# Patient Record
Sex: Male | Born: 1935 | Race: White | Hispanic: No | Marital: Married
Health system: Southern US, Community
[De-identification: ages and names within clinical notes are randomized; demographics above are authoritative.]

## PROBLEM LIST (undated history)

## (undated) DIAGNOSIS — C801 Malignant (primary) neoplasm, unspecified: Secondary | ICD-10-CM

## (undated) DIAGNOSIS — E119 Type 2 diabetes mellitus without complications: Secondary | ICD-10-CM

## (undated) DIAGNOSIS — C4490 Unspecified malignant neoplasm of skin, unspecified: Secondary | ICD-10-CM

## (undated) DIAGNOSIS — I2109 ST elevation (STEMI) myocardial infarction involving other coronary artery of anterior wall: Secondary | ICD-10-CM

## (undated) DIAGNOSIS — I10 Essential (primary) hypertension: Secondary | ICD-10-CM

## (undated) DIAGNOSIS — R001 Bradycardia, unspecified: Secondary | ICD-10-CM

## (undated) DIAGNOSIS — R570 Cardiogenic shock: Secondary | ICD-10-CM

---

## 2002-07-25 ENCOUNTER — Ambulatory Visit: Admission: RE | Admit: 2002-07-25 | Discharge: 2002-10-04 | Payer: Self-pay | Admitting: Radiation Oncology

## 2002-08-13 ENCOUNTER — Ambulatory Visit (HOSPITAL_COMMUNITY): Admission: RE | Admit: 2002-08-13 | Discharge: 2002-08-13 | Payer: Self-pay | Admitting: Radiation Oncology

## 2002-08-15 ENCOUNTER — Other Ambulatory Visit: Admission: RE | Admit: 2002-08-15 | Discharge: 2002-08-15 | Payer: Self-pay | Admitting: Oncology

## 2002-08-15 ENCOUNTER — Encounter: Payer: Self-pay | Admitting: Oncology

## 2003-01-05 ENCOUNTER — Ambulatory Visit: Admission: RE | Admit: 2003-01-05 | Discharge: 2003-01-05 | Payer: Self-pay | Admitting: Radiation Oncology

## 2003-03-07 ENCOUNTER — Ambulatory Visit: Admission: RE | Admit: 2003-03-07 | Discharge: 2003-03-07 | Payer: Self-pay | Admitting: Radiation Oncology

## 2003-05-08 ENCOUNTER — Ambulatory Visit: Admission: RE | Admit: 2003-05-08 | Discharge: 2003-05-08 | Payer: Self-pay | Admitting: Radiation Oncology

## 2003-08-07 ENCOUNTER — Ambulatory Visit: Admission: RE | Admit: 2003-08-07 | Discharge: 2003-08-07 | Payer: Self-pay | Admitting: Radiation Oncology

## 2003-08-10 ENCOUNTER — Ambulatory Visit: Admission: RE | Admit: 2003-08-10 | Discharge: 2003-08-10 | Payer: Self-pay | Admitting: Radiation Oncology

## 2003-12-12 ENCOUNTER — Ambulatory Visit: Admission: RE | Admit: 2003-12-12 | Discharge: 2003-12-12 | Payer: Self-pay | Admitting: Radiation Oncology

## 2004-04-12 ENCOUNTER — Ambulatory Visit: Admission: RE | Admit: 2004-04-12 | Discharge: 2004-04-12 | Payer: Self-pay | Admitting: *Deleted

## 2004-10-09 ENCOUNTER — Ambulatory Visit: Admission: RE | Admit: 2004-10-09 | Discharge: 2004-10-09 | Payer: Self-pay | Admitting: *Deleted

## 2012-04-05 ENCOUNTER — Ambulatory Visit
Admission: RE | Admit: 2012-04-05 | Discharge: 2012-04-05 | Disposition: A | Discharge status: Home / Self Care | Payer: Medicare Other | Source: Ambulatory Visit | Attending: Family Medicine | Admitting: Family Medicine

## 2012-04-05 ENCOUNTER — Other Ambulatory Visit: Payer: Self-pay | Admitting: Family Medicine

## 2012-04-05 DIAGNOSIS — R05 Cough: Secondary | ICD-10-CM

## 2012-04-05 DIAGNOSIS — R509 Fever, unspecified: Secondary | ICD-10-CM

## 2013-03-06 ENCOUNTER — Emergency Department (HOSPITAL_COMMUNITY): Payer: Medicare Other

## 2013-03-06 ENCOUNTER — Inpatient Hospital Stay (HOSPITAL_COMMUNITY): Payer: Medicare Other

## 2013-03-06 ENCOUNTER — Ambulatory Visit (HOSPITAL_COMMUNITY): Admit: 2013-03-06 | Payer: Self-pay | Admitting: Interventional Cardiology

## 2013-03-06 ENCOUNTER — Inpatient Hospital Stay (HOSPITAL_COMMUNITY)
Admission: EM | Admit: 2013-03-06 | Discharge: 2013-03-17 | DRG: 238 | Disposition: E | Payer: Medicare Other | Attending: Internal Medicine | Admitting: Internal Medicine

## 2013-03-06 ENCOUNTER — Encounter (HOSPITAL_COMMUNITY): Payer: Self-pay | Admitting: Emergency Medicine

## 2013-03-06 ENCOUNTER — Encounter (HOSPITAL_COMMUNITY): Admission: EM | Disposition: E | Payer: Self-pay | Source: Home / Self Care | Attending: Internal Medicine

## 2013-03-06 DIAGNOSIS — I2510 Atherosclerotic heart disease of native coronary artery without angina pectoris: Secondary | ICD-10-CM

## 2013-03-06 DIAGNOSIS — I472 Ventricular tachycardia, unspecified: Secondary | ICD-10-CM | POA: Diagnosis present

## 2013-03-06 DIAGNOSIS — I469 Cardiac arrest, cause unspecified: Secondary | ICD-10-CM

## 2013-03-06 DIAGNOSIS — Z515 Encounter for palliative care: Secondary | ICD-10-CM

## 2013-03-06 DIAGNOSIS — I498 Other specified cardiac arrhythmias: Secondary | ICD-10-CM

## 2013-03-06 DIAGNOSIS — I10 Essential (primary) hypertension: Secondary | ICD-10-CM

## 2013-03-06 DIAGNOSIS — Z85828 Personal history of other malignant neoplasm of skin: Secondary | ICD-10-CM

## 2013-03-06 DIAGNOSIS — I4729 Other ventricular tachycardia: Secondary | ICD-10-CM | POA: Diagnosis present

## 2013-03-06 DIAGNOSIS — I2109 ST elevation (STEMI) myocardial infarction involving other coronary artery of anterior wall: Secondary | ICD-10-CM

## 2013-03-06 DIAGNOSIS — R570 Cardiogenic shock: Secondary | ICD-10-CM

## 2013-03-06 DIAGNOSIS — I4510 Unspecified right bundle-branch block: Secondary | ICD-10-CM | POA: Diagnosis present

## 2013-03-06 DIAGNOSIS — I359 Nonrheumatic aortic valve disorder, unspecified: Secondary | ICD-10-CM

## 2013-03-06 DIAGNOSIS — E119 Type 2 diabetes mellitus without complications: Secondary | ICD-10-CM | POA: Diagnosis present

## 2013-03-06 DIAGNOSIS — I214 Non-ST elevation (NSTEMI) myocardial infarction: Principal | ICD-10-CM

## 2013-03-06 DIAGNOSIS — J811 Chronic pulmonary edema: Secondary | ICD-10-CM | POA: Diagnosis present

## 2013-03-06 DIAGNOSIS — R001 Bradycardia, unspecified: Secondary | ICD-10-CM

## 2013-03-06 DIAGNOSIS — E785 Hyperlipidemia, unspecified: Secondary | ICD-10-CM

## 2013-03-06 HISTORY — DX: Bradycardia, unspecified: R00.1

## 2013-03-06 HISTORY — DX: Malignant (primary) neoplasm, unspecified: C80.1

## 2013-03-06 HISTORY — DX: ST elevation (STEMI) myocardial infarction involving other coronary artery of anterior wall: I21.09

## 2013-03-06 HISTORY — PX: PERCUTANEOUS CORONARY STENT INTERVENTION (PCI-S): SHX5485

## 2013-03-06 HISTORY — PX: INTRA-AORTIC BALLOON PUMP INSERTION: SHX5475

## 2013-03-06 HISTORY — PX: TEMPORARY PACEMAKER INSERTION: SHX5471

## 2013-03-06 HISTORY — PX: LEFT HEART CATH: SHX5478

## 2013-03-06 HISTORY — DX: Type 2 diabetes mellitus without complications: E11.9

## 2013-03-06 HISTORY — DX: Essential (primary) hypertension: I10

## 2013-03-06 HISTORY — DX: Unspecified malignant neoplasm of skin, unspecified: C44.90

## 2013-03-06 HISTORY — DX: Cardiogenic shock: R57.0

## 2013-03-06 LAB — POCT I-STAT 3, ART BLOOD GAS (G3+)
Acid-base deficit: 9 mmol/L — ABNORMAL HIGH (ref 0.0–2.0)
Bicarbonate: 16.8 mEq/L — ABNORMAL LOW (ref 20.0–24.0)
Bicarbonate: 16.7 mEq/L — ABNORMAL LOW (ref 20.0–24.0)
O2 Saturation: 97 %
O2 Saturation: 84 %
Patient temperature: 33.4
TCO2: 19 mmol/L (ref 0–100)
TCO2: 18 mmol/L (ref 0–100)
pCO2 arterial: 41.4 mmHg (ref 35.0–45.0)
pCO2 arterial: 31.1 mmHg — ABNORMAL LOW (ref 35.0–45.0)
pCO2 arterial: 37.6 mmHg (ref 35.0–45.0)
pH, Arterial: 7.233 — ABNORMAL LOW (ref 7.350–7.450)
pH, Arterial: 7.322 — ABNORMAL LOW (ref 7.350–7.450)
pH, Arterial: 7.257 — ABNORMAL LOW (ref 7.350–7.450)
pO2, Arterial: 57 mmHg — ABNORMAL LOW (ref 80.0–100.0)

## 2013-03-06 LAB — POCT I-STAT, CHEM 8
BUN: 22 mg/dL (ref 6–23)
BUN: 26 mg/dL — ABNORMAL HIGH (ref 6–23)
BUN: 20 mg/dL (ref 6–23)
BUN: 20 mg/dL (ref 6–23)
Calcium, Ion: 1.03 mmol/L — ABNORMAL LOW (ref 1.13–1.30)
Calcium, Ion: 1.06 mmol/L — ABNORMAL LOW (ref 1.13–1.30)
Calcium, Ion: 1.06 mmol/L — ABNORMAL LOW (ref 1.13–1.30)
Calcium, Ion: 1.07 mmol/L — ABNORMAL LOW (ref 1.13–1.30)
Chloride: 106 mEq/L (ref 96–112)
Chloride: 108 mEq/L (ref 96–112)
Creatinine, Ser: 0.9 mg/dL (ref 0.50–1.35)
Creatinine, Ser: 0.8 mg/dL (ref 0.50–1.35)
Creatinine, Ser: 0.8 mg/dL (ref 0.50–1.35)
Creatinine, Ser: 0.9 mg/dL (ref 0.50–1.35)
Glucose, Bld: 323 mg/dL — ABNORMAL HIGH (ref 70–99)
Glucose, Bld: 313 mg/dL — ABNORMAL HIGH (ref 70–99)
Glucose, Bld: 319 mg/dL — ABNORMAL HIGH (ref 70–99)
Glucose, Bld: 313 mg/dL — ABNORMAL HIGH (ref 70–99)
Glucose, Bld: 293 mg/dL — ABNORMAL HIGH (ref 70–99)
HCT: 41 % (ref 39.0–52.0)
HCT: 42 % (ref 39.0–52.0)
Hemoglobin: 13.9 g/dL (ref 13.0–17.0)
Hemoglobin: 13.9 g/dL (ref 13.0–17.0)
Potassium: 3.3 mEq/L — ABNORMAL LOW (ref 3.5–5.1)
Potassium: 3.8 mEq/L (ref 3.5–5.1)
Potassium: 3 mEq/L — ABNORMAL LOW (ref 3.5–5.1)
Potassium: 3.2 mEq/L — ABNORMAL LOW (ref 3.5–5.1)
Sodium: 144 mEq/L (ref 135–145)
TCO2: 17 mmol/L (ref 0–100)
TCO2: 15 mmol/L (ref 0–100)
TCO2: 15 mmol/L (ref 0–100)

## 2013-03-06 LAB — BLOOD GAS, ARTERIAL
Acid-base deficit: 10.6 mmol/L — ABNORMAL HIGH (ref 0.0–2.0)
Bicarbonate: 16.1 mEq/L — ABNORMAL LOW (ref 20.0–24.0)
Drawn by: 25770
FIO2: 1 %
MECHVT: 600 mL
O2 Saturation: 82.1 %
PEEP: 10 cmH2O
Patient temperature: 98.6
RATE: 15 resp/min
TCO2: 17.4 mmol/L (ref 0–100)
pCO2 arterial: 43.6 mmHg (ref 35.0–45.0)
pH, Arterial: 7.191 — CL (ref 7.350–7.450)
pO2, Arterial: 58.7 mmHg — ABNORMAL LOW (ref 80.0–100.0)

## 2013-03-06 LAB — POCT I-STAT TROPONIN I

## 2013-03-06 LAB — CBC WITH DIFFERENTIAL/PLATELET
Basophils Absolute: 0 10*3/uL (ref 0.0–0.1)
Basophils Relative: 0 % (ref 0–1)
Eosinophils Absolute: 0.1 10*3/uL (ref 0.0–0.7)
HCT: 38.5 % — ABNORMAL LOW (ref 39.0–52.0)
Hemoglobin: 12.8 g/dL — ABNORMAL LOW (ref 13.0–17.0)
Lymphocytes Relative: 37 % (ref 12–46)
MCH: 29 pg (ref 26.0–34.0)
MCHC: 33.2 g/dL (ref 30.0–36.0)
Monocytes Absolute: 0.6 10*3/uL (ref 0.1–1.0)
Neutro Abs: 6.5 10*3/uL (ref 1.7–7.7)
Neutrophils Relative %: 57 % (ref 43–77)
Platelets: 173 10*3/uL (ref 150–400)

## 2013-03-06 LAB — CREATININE, SERUM
Creatinine, Ser: 0.91 mg/dL (ref 0.50–1.35)
GFR calc Af Amer: >90 mL/min (ref >90)
GFR calc non Af Amer: 80 mL/min — ABNORMAL LOW (ref >90)

## 2013-03-06 LAB — CBC
HCT: 40.1 % (ref 39.0–52.0)
Hemoglobin: 13.6 g/dL (ref 13.0–17.0)
MCH: 28.6 pg (ref 26.0–34.0)
MCHC: 33.9 g/dL (ref 30.0–36.0)
WBC: 20.2 10*3/uL — ABNORMAL HIGH (ref 4.0–10.5)

## 2013-03-06 LAB — COMPREHENSIVE METABOLIC PANEL
ALT: 181 U/L — ABNORMAL HIGH (ref 0–53)
Albumin: 2.9 g/dL — ABNORMAL LOW (ref 3.5–5.2)
Alkaline Phosphatase: 54 U/L (ref 39–117)
BUN: 26 mg/dL — ABNORMAL HIGH (ref 6–23)
Potassium: 3.1 mEq/L — ABNORMAL LOW (ref 3.5–5.1)
Sodium: 139 mEq/L (ref 135–145)
Total Protein: 5.3 g/dL — ABNORMAL LOW (ref 6.0–8.3)

## 2013-03-06 LAB — CG4 I-STAT (LACTIC ACID): Lactic Acid, Venous: 6.46 mmol/L — ABNORMAL HIGH (ref 0.5–2.2)

## 2013-03-06 LAB — URINE MICROSCOPIC-ADD ON

## 2013-03-06 LAB — GLUCOSE, CAPILLARY
Glucose-Capillary: 254 mg/dL — ABNORMAL HIGH (ref 70–99)
Glucose-Capillary: 248 mg/dL — ABNORMAL HIGH (ref 70–99)

## 2013-03-06 LAB — URINALYSIS, ROUTINE W REFLEX MICROSCOPIC
Bilirubin Urine: NEGATIVE
Ketones, ur: 15 mg/dL — AB
Nitrite: NEGATIVE
Protein, ur: NEGATIVE mg/dL
Specific Gravity, Urine: >1.046 — ABNORMAL HIGH (ref 1.005–1.030)
Urobilinogen, UA: 1 mg/dL (ref 0.0–1.0)
pH: 5 (ref 5.0–8.0)

## 2013-03-06 LAB — APTT: aPTT: 30 seconds (ref 24–37)

## 2013-03-06 LAB — PRO B NATRIURETIC PEPTIDE: Pro B Natriuretic peptide (BNP): 130.9 pg/mL (ref 0–450)

## 2013-03-06 LAB — TROPONIN I
Troponin I: 8.05 ng/mL (ref <0.30)
Troponin I: >20 ng/mL (ref <0.30)

## 2013-03-06 LAB — PROTIME-INR
INR: 1.2 (ref 0.00–1.49)
Prothrombin Time: 14.9 seconds (ref 11.6–15.2)

## 2013-03-06 LAB — MRSA PCR SCREENING: MRSA by PCR: NEGATIVE

## 2013-03-06 LAB — MAGNESIUM: Magnesium: 2 mg/dL (ref 1.5–2.5)

## 2013-03-06 LAB — PROCALCITONIN: Procalcitonin: <0.1 ng/mL

## 2013-03-06 SURGERY — LEFT HEART CATH
Anesthesia: LOCAL

## 2013-03-06 MED ORDER — CISATRACURIUM BOLUS VIA INFUSION
0.1000 mg/kg | Freq: Once | INTRAVENOUS | Status: AC
  Administered 2013-03-06: 7.9 mg via INTRAVENOUS
  Filled 2013-03-06: qty 8

## 2013-03-06 MED ORDER — HEPARIN (PORCINE) IN NACL 100-0.45 UNIT/ML-% IJ SOLN
1100.0000 [IU]/h | INTRAMUSCULAR | Status: DC
  Filled 2013-03-06 (×2): qty 250

## 2013-03-06 MED ORDER — NOREPINEPHRINE BITARTRATE 1 MG/ML IJ SOLN
0.5000 ug/min | INTRAMUSCULAR | Status: DC
  Administered 2013-03-06: 0.5 ug/min via INTRAVENOUS
  Filled 2013-03-06 (×2): qty 4

## 2013-03-06 MED ORDER — LIDOCAINE HCL (CARDIAC) 20 MG/ML IV SOLN
INTRAVENOUS | Status: AC
  Filled 2013-03-06: qty 5

## 2013-03-06 MED ORDER — TICAGRELOR 90 MG PO TABS
ORAL_TABLET | ORAL | Status: AC
  Filled 2013-03-06: qty 1

## 2013-03-06 MED ORDER — SODIUM CHLORIDE 0.9 % IV SOLN: 1.0000 mL/kg/h | INTRAVENOUS | Status: AC

## 2013-03-06 MED ORDER — SODIUM CHLORIDE 0.9 % IV SOLN
INTRAVENOUS | Status: DC
  Administered 2013-03-06: 1.9 [IU]/h via INTRAVENOUS
  Administered 2013-03-07: 14.2 [IU]/h via INTRAVENOUS
  Filled 2013-03-06 (×2): qty 1

## 2013-03-06 MED ORDER — AMIODARONE HCL IN DEXTROSE 360-4.14 MG/200ML-% IV SOLN: 30.0000 mg/h | INTRAVENOUS | Status: DC

## 2013-03-06 MED ORDER — CISATRACURIUM BESYLATE 10 MG/ML IV SOLN
1.0000 ug/kg/min | INTRAVENOUS | Status: DC
  Administered 2013-03-06: 1 ug/kg/min via INTRAVENOUS
  Filled 2013-03-06: qty 20

## 2013-03-06 MED ORDER — HEPARIN SODIUM (PORCINE) 1000 UNIT/ML IJ SOLN
INTRAMUSCULAR | Status: AC
  Filled 2013-03-06: qty 1

## 2013-03-06 MED ORDER — HEPARIN (PORCINE) IN NACL 2-0.9 UNIT/ML-% IJ SOLN
INTRAMUSCULAR | Status: AC
  Filled 2013-03-06: qty 1000

## 2013-03-06 MED ORDER — HEPARIN SODIUM (PORCINE) 5000 UNIT/ML IJ SOLN
5000.0000 [IU] | Freq: Three times a day (TID) | INTRAMUSCULAR | Status: DC
  Filled 2013-03-06 (×3): qty 1

## 2013-03-06 MED ORDER — SODIUM CHLORIDE 0.9 % IV SOLN: 250.0000 mL | INTRAVENOUS | Status: DC | PRN

## 2013-03-06 MED ORDER — ASPIRIN EC 325 MG PO TBEC
325.0000 mg | DELAYED_RELEASE_TABLET | Freq: Every day | ORAL | Status: DC
  Filled 2013-03-06: qty 1

## 2013-03-06 MED ORDER — HEPARIN (PORCINE) IN NACL 2-0.9 UNIT/ML-% IJ SOLN
INTRAMUSCULAR | Status: AC
  Filled 2013-03-06: qty 500

## 2013-03-06 MED ORDER — SUCCINYLCHOLINE CHLORIDE 20 MG/ML IJ SOLN
INTRAMUSCULAR | Status: AC
  Filled 2013-03-06: qty 1

## 2013-03-06 MED ORDER — SODIUM CHLORIDE 0.9 % IV SOLN
25.0000 ug/h | INTRAVENOUS | Status: DC
  Administered 2013-03-06: 25 ug/h via INTRAVENOUS
  Filled 2013-03-06: qty 50

## 2013-03-06 MED ORDER — NITROGLYCERIN 0.2 MG/ML ON CALL CATH LAB
INTRAVENOUS | Status: AC
  Filled 2013-03-06: qty 1

## 2013-03-06 MED ORDER — ACETAMINOPHEN 325 MG PO TABS: 650.0000 mg | ORAL_TABLET | ORAL | Status: DC | PRN

## 2013-03-06 MED ORDER — CISATRACURIUM BOLUS VIA INFUSION
0.0500 mg/kg | INTRAVENOUS | Status: DC | PRN
  Filled 2013-03-06: qty 4

## 2013-03-06 MED ORDER — SODIUM CHLORIDE 0.9 % IV SOLN
INTRAVENOUS | Status: AC
  Administered 2013-03-06: 14:00:00 via INTRAVENOUS

## 2013-03-06 MED ORDER — IOHEXOL 350 MG/ML SOLN
80.0000 mL | Freq: Once | INTRAVENOUS | Status: AC | PRN
  Administered 2013-03-06: 80 mL via INTRAVENOUS

## 2013-03-06 MED ORDER — POTASSIUM CHLORIDE 10 MEQ/50ML IV SOLN
INTRAVENOUS | Status: AC
  Filled 2013-03-06: qty 50

## 2013-03-06 MED ORDER — INSULIN ASPART 100 UNIT/ML ~~LOC~~ SOLN
0.0000 [IU] | SUBCUTANEOUS | Status: DC
  Administered 2013-03-06: 7 [IU] via SUBCUTANEOUS

## 2013-03-06 MED ORDER — NOREPINEPHRINE BITARTRATE 1 MG/ML IJ SOLN
0.5000 ug/min | INTRAVENOUS | Status: DC
  Administered 2013-03-06: 20 ug/min via INTRAVENOUS
  Administered 2013-03-07: 50 ug/min via INTRAVENOUS
  Filled 2013-03-06 (×3): qty 16

## 2013-03-06 MED ORDER — SODIUM CHLORIDE 0.9 % IV SOLN
2000.0000 mL | Freq: Once | INTRAVENOUS | Status: AC
  Administered 2013-03-06: 1000 mL via INTRAVENOUS

## 2013-03-06 MED ORDER — TICAGRELOR 90 MG PO TABS
90.0000 mg | ORAL_TABLET | Freq: Two times a day (BID) | ORAL | Status: DC
  Filled 2013-03-06 (×2): qty 1

## 2013-03-06 MED ORDER — SODIUM CHLORIDE 0.9 % IV SOLN
3.0000 g | Freq: Four times a day (QID) | INTRAVENOUS | Status: DC
  Administered 2013-03-06 – 2013-03-07 (×3): 3 g via INTRAVENOUS
  Filled 2013-03-06 (×6): qty 3

## 2013-03-06 MED ORDER — ALBUTEROL SULFATE HFA 108 (90 BASE) MCG/ACT IN AERS: 4.0000 | INHALATION_SPRAY | RESPIRATORY_TRACT | Status: DC | PRN

## 2013-03-06 MED ORDER — ASPIRIN 300 MG RE SUPP: 300.0000 mg | RECTAL | Status: DC

## 2013-03-06 MED ORDER — AMIODARONE HCL IN DEXTROSE 360-4.14 MG/200ML-% IV SOLN
30.0000 mg/h | INTRAVENOUS | Status: DC
  Administered 2013-03-06: 30 mg/h via INTRAVENOUS
  Filled 2013-03-06 (×4): qty 200

## 2013-03-06 MED ORDER — ASPIRIN 81 MG PO CHEW: 81.0000 mg | CHEWABLE_TABLET | Freq: Every day | ORAL | Status: DC

## 2013-03-06 MED ORDER — TIROFIBAN HCL IV 5 MG/100ML
INTRAVENOUS | Status: AC
  Filled 2013-03-06: qty 100

## 2013-03-06 MED ORDER — CHLORHEXIDINE GLUCONATE 0.12 % MT SOLN
15.0000 mL | Freq: Two times a day (BID) | OROMUCOSAL | Status: DC
  Administered 2013-03-06: 15 mL via OROMUCOSAL

## 2013-03-06 MED ORDER — ASPIRIN 300 MG RE SUPP
300.0000 mg | RECTAL | Status: AC
  Administered 2013-03-06: 300 mg via RECTAL
  Filled 2013-03-06: qty 1

## 2013-03-06 MED ORDER — PANTOPRAZOLE SODIUM 40 MG IV SOLR
40.0000 mg | Freq: Every day | INTRAVENOUS | Status: DC
  Administered 2013-03-07: 40 mg via INTRAVENOUS
  Filled 2013-03-06 (×2): qty 40

## 2013-03-06 MED ORDER — SODIUM CHLORIDE 0.9 % IV SOLN
0.5000 ug/kg/min | INTRAVENOUS | Status: DC
  Filled 2013-03-06: qty 20

## 2013-03-06 MED ORDER — BIOTENE DRY MOUTH MT LIQD
15.0000 mL | Freq: Four times a day (QID) | OROMUCOSAL | Status: DC
  Administered 2013-03-06 – 2013-03-07 (×2): 15 mL via OROMUCOSAL

## 2013-03-06 MED ORDER — TIROFIBAN HCL IV 5 MG/100ML
0.1500 ug/kg/min | INTRAVENOUS | Status: DC
  Administered 2013-03-06 (×2): 0.15 ug/kg/min via INTRAVENOUS
  Filled 2013-03-06 (×6): qty 100

## 2013-03-06 MED ORDER — HEPARIN (PORCINE) IN NACL 100-0.45 UNIT/ML-% IJ SOLN
900.0000 [IU]/h | INTRAMUSCULAR | Status: DC
  Filled 2013-03-06: qty 250

## 2013-03-06 MED ORDER — SODIUM CHLORIDE 0.9 % IV SOLN: INTRAVENOUS | Status: DC

## 2013-03-06 MED ORDER — POTASSIUM CHLORIDE 10 MEQ/50ML IV SOLN
10.0000 meq | INTRAVENOUS | Status: DC
Start: 2013-03-06 — End: 2013-03-06
  Administered 2013-03-06: 10 meq via INTRAVENOUS
  Filled 2013-03-06 (×4): qty 50

## 2013-03-06 MED ORDER — ONDANSETRON HCL 4 MG/2ML IJ SOLN: 4.0000 mg | Freq: Four times a day (QID) | INTRAMUSCULAR | Status: DC | PRN

## 2013-03-06 MED ORDER — AMIODARONE HCL 150 MG/3ML IV SOLN
INTRAVENOUS | Status: AC
  Filled 2013-03-06: qty 3

## 2013-03-06 MED ORDER — AMIODARONE HCL IN DEXTROSE 360-4.14 MG/200ML-% IV SOLN
60.0000 mg/h | INTRAVENOUS | Status: DC
  Administered 2013-03-06: 60 mg/h via INTRAVENOUS
  Filled 2013-03-06 (×2): qty 200

## 2013-03-06 MED ORDER — SODIUM CHLORIDE 0.9 % IV SOLN
1.0000 mg/h | INTRAVENOUS | Status: DC
  Administered 2013-03-06: 1 mg/h via INTRAVENOUS
  Administered 2013-03-06: 2 mg/h via INTRAVENOUS
  Filled 2013-03-06 (×2): qty 10

## 2013-03-06 MED ORDER — ASPIRIN EC 81 MG PO TBEC
81.0000 mg | DELAYED_RELEASE_TABLET | Freq: Every day | ORAL | Status: DC
  Filled 2013-03-06: qty 1

## 2013-03-06 MED ORDER — POTASSIUM CHLORIDE 10 MEQ/50ML IV SOLN
10.0000 meq | INTRAVENOUS | Status: AC
  Administered 2013-03-06 – 2013-03-07 (×4): 10 meq via INTRAVENOUS
  Filled 2013-03-06 (×2): qty 50

## 2013-03-06 MED ORDER — LIDOCAINE HCL (PF) 1 % IJ SOLN
INTRAMUSCULAR | Status: AC
  Filled 2013-03-06: qty 30

## 2013-03-06 MED ORDER — ETOMIDATE 2 MG/ML IV SOLN
INTRAVENOUS | Status: AC
  Filled 2013-03-06: qty 20

## 2013-03-06 MED ORDER — ROCURONIUM BROMIDE 50 MG/5ML IV SOLN
INTRAVENOUS | Status: AC
  Filled 2013-03-06: qty 2

## 2013-03-06 NOTE — CV Procedure (Addendum)
PROCEDURE:  Left heart catheterization with selective coronary angiography, PCI LAD, IABP placement, temporary pacer placement.  INDICATIONS:  Cardiac arrest, NSTEMI, cardiogenic shock  The risks, benefits, and details of the procedure were explained to the patient.  The patient verbalized understanding and wanted to proceed.  Informed written consent was obtained.  PROCEDURE TECHNIQUE:  Right radial artery already had an arterial line in place.  Cooling pads in place making use of left radial artery difficult.  After Xylocaine anesthesia a 18F sheath was placed in the right femoral artery with a single anterior needle wall stick.   Left coronary angiography was done using a Judkins L4 guide catheter.  Right coronary angiography was done using a Judkins R4 guide catheter.  Left heart catheterization was done using a pigtail catheter.  Heparin and tirofiban was used for anticoagulation.  ACT was checked to ensure adequate anticoagulation.  Heparin drip for IABP.   CONTRAST:  Total of 125 cc.  COMPLICATIONS:  None.    HEMODYNAMICS:  Aortic pressure was 104/70; LV pressure was 105/16; LVEDP 20.  There was no gradient between the left ventricle and aorta.    ANGIOGRAPHIC DATA:   The left main coronary artery is patent with mild disease.  The left anterior descending artery is a large vessel that wraps around the apex.  There is mild ostial disease.  After the first septal perforator, there is a 99% mid LAD lesion.  There is moderate disease after this lesion.  The first diagonal is small and patent.  The second diagonal is medium and widely patent.  The third diagonal is occluded.  There is mild to moderate diffuse disease in the remainder of the LAD.  The left circumflex artery is a large dominant vessel.  There is moderate ostial disease in some views, up to 50%.  There is a 30% lesion at the OM1.  There is a 50% lesion at the origin of the OM2.  THere is a 70% lesion in the proximal left PDA.   There was TIMI 3 flow in all of the branches and main circumflex system.  The right coronary artery is a small nondominant vessel.  LEFT VENTRICULOGRAM:  Left ventricular angiogram was not done.  LVEDP was 20 mmHg.  During the left heart cath, the patient had ventricular tachycardia and was defibrillated with a single 200 J shock back to NSR.  Amiodarone drip was started.  PCI NARRATIVE: A CLS 3.5 Guide was used to engage the left main.  A prowater wire was used to cross the lesion.  A 2.5x 15 balloon was used to predilate.  A 2.5 x 38 Promus DES was deployed across the area of disease.  A 2.5 x 20 [place] balloon was used to postdilate the lesion several times to 16 Atm.  The wire was place in the occluded third diagonal.  There was restoration of TIMI 3 flow with the wire.  No balloon was needed.  Several doses of NTG were used to treat vasospasm as BP tolerated.    IABP was then placed via the right groin with fluoroscopic guidance.  It was left on 1:1.  Due to bradycardia and hypotension, a temporary pacer was placed with a back up rate of 50 bpm.  Capture was excellent with the balloon inflated, and lost at 0.5 mA output.  Capture was lost at higher output with the balloon down, so the balloon was left up. At the end of the case, the patient was using the  pacer intermittently.   IMPRESSIONS:  1. Patent left main coronary artery with some mild distal disease likely extending into the ostial LAD. 2. 99% mid left anterior descending artery stenosis.  Occluded third diagonal, opened with the wire-TIMI 3 flow restored.  LAD stented with 2.5 x 38 Promus, postdilated to 2.6 mm in diameter.  Moderate stenosis at the origin of the third diagonal. 3. Moderate ostial stenosis of the left circumflex artery.  Moderate disease in the mid vessel after the 3rd diagonal.  TIMI 3 flow throughout.   4. Nondominant right coronary artery. 5. Left ventricular systolic function not assessed.  LVEDP 20 mmHg.  Ejection  fraction 30-35% by echocardiogram.  RECOMMENDATION:  Continue tirofiban for 24 hours while on the arctic sun protocol.  He will need aspirin and brilinta. IABP at 1:1.  Temp pacer can be adjusted if cardiogenic shock from bradycardia becomes more of an issue.  Consider stopping amiodarone if bradycardia becomes more of an issue.  Will need statin.   No beta blocker for now while on pressors.    I spoke to the family and explained his critical illness and guarded prognosis.

## 2013-03-06 NOTE — Progress Notes (Signed)
VS , IV meds , balloon pump readings ,and arctic sun protocol reviewed w/Dr Eldridge Dace . Dr Delton See to arrive shortly to assess Pt and balloon pump  .no fluid boluses to be given due to CVP values .

## 2013-03-06 NOTE — H&P (Addendum)
Name: DOCTOR SHEAHAN MRN: [id number] DOB: 02-07-1935    ADMISSION DATE:  04-03-13  REFERRING MD :  EDP  PRIMARY SERVICE: PCCM   CHIEF COMPLAINT:  PEA arrest consult   BRIEF PATIENT DESCRIPTION: 77 yo WM witnessed PEA arrest with 30 min of CPR/ACLS . Hypothermia protocol initiated. CT chest /head pending   SIGNIFICANT EVENTS / STUDIES:  12/21 Witnessed PEA arrest -20 min CPR/ACLS   LINES / TUBES: 12/21 ETT>> 12/21 R IJ >>  CULTURES:  ANTIBIOTICS:  HISTORY OF PRESENT ILLNESS:   78 year old male with known hypertension (on triamterene and metoprolol at home) and hyperlipidemia (on pravastatin at home), prior skin cancer who had a witnessed cardiac arrest on 12/21 at home.  He was reading newspaper with his wife when he started to gasp for breath, stiffened up and was non-responsive with eyes wide opened. His wife called 911 and was advised to start CPR that she did with immediate chest compressions and mouth to mouth breathing. Shortly after EMS came and found the patient in PEA arrest. They continued CPR for 20 minutes with 3 epinephrine injections and the patient went into VT from which he was shocked into SR . Received 3 epi . 1 D50 . IVF x 1 L infused . Upon arrival to the ER he had some spontaneous movement of his extremities. Based on his family he was very active, with no complains of chest pain or SOB . PCCM asked to admit. Pt is intubated on vent . Awaiting CT head and chest -PE protocol.  All labs are pending . EKG with R. BBB  Hypotensive started on pressors .    PAST MEDICAL HISTORY :  Past Medical History  Diagnosis Date  . Cancer   . Skin cancer   . Hypertension   . Diabetes mellitus without complication    History reviewed. No pertinent past surgical history. Prior to Admission medications   Not on File   Not on File  FAMILY HISTORY:  History reviewed. No pertinent family history. SOCIAL HISTORY:  has no tobacco, alcohol, and drug history on  file.  REVIEW OF SYSTEMS:  Unable to obtain as pt is sedated on vent   SUBJECTIVE:  PEA arrest   VITAL SIGNS: Pulse Rate:  [34-74] 58 (12/21 0845) Resp:  [12-21] 21 (12/21 0845) BP: (93-102)/(60-70) 102/70 mmHg (12/21 0845) SpO2:  [82 %-98 %] 95 % (12/21 0845) FiO2 (%):  [75 %] 75 % (12/21 0821) Weight:  [78.926 kg (174 lb)] 78.926 kg (174 lb) (12/21 0845) HEMODYNAMICS:   VENTILATOR SETTINGS: Vent Mode:  [-] PRVC FiO2 (%):  [75 %] 75 % Set Rate:  [15 bmp-24 bmp] 24 bmp Vt Set:  [580 mL] 580 mL PEEP:  [5 cmH20] 5 cmH20 Plateau Pressure:  [13 cmH20] 13 cmH20 INTAKE / OUTPUT: Intake/Output     12/20 0701 - 12/21 0700 12/21 0701 - 12/22 0700   I.V. (mL/kg)  1000 (12.7)   Total Intake(mL/kg)  1000 (12.7)   Net   +1000          PHYSICAL EXAMINATION: General:  Sedated on vent  Neuro:  Sedated  HEENT:  ETT in plac, major jvd Cardiovascular:  NSR , no m/r/g  Lungs:  Coarse crackles bilaterally  Abdomen:  Soft , BS hypoactive, lerge inguinal hernia left, non reducinble, involves scrotum , not warm or hot Musculoskeletal:  Intact  Skin:  Intact w/o rash   LABS:  CBC No results found for this basename:  WBC, HGB, HCT, PLT,  in the last 168 hours Coag's No results found for this basename: APTT, INR,  in the last 168 hours BMET No results found for this basename: NA, K, CL, CO2, BUN, CREATININE, GLUCOSE,  in the last 168 hours Electrolytes No results found for this basename: CALCIUM, MG, PHOS,  in the last 168 hours Sepsis Markers  Recent Labs Lab 2013-03-19 0901  LATICACIDVEN 6.46*   ABG  Recent Labs Lab 2013-03-19 0833  PHART 7.233*  PCO2ART 41.4  PO2ART 101.0*   Liver Enzymes No results found for this basename: AST, ALT, ALKPHOS, BILITOT, ALBUMIN,  in the last 168 hours Cardiac Enzymes No results found for this basename: TROPONINI, PROBNP,  in the last 168 hours Glucose No results found for this basename: GLUCAP,  in the last 168 hours  Imaging Dg Chest Port  1 View  Mar 19, 2013   CLINICAL DATA:  Evaluate endotracheal tube placement.  EXAM: PORTABLE CHEST - 1 VIEW  COMPARISON:  Chest x-ray 03/05/2013.  FINDINGS: An endotracheal tube is in place with tip 1.3 cm above the carina. Nasogastric tube extends into the proximal stomach, with side port distal to the gastroesophageal junction. Transcutaneous defibrillator pads are seen projecting over the left hemithorax and left upper quadrant of the abdomen. Multiple surgical clips are seen projecting over the right axillary region and left hilar region. Lung volumes are low. There is cephalization of the pulmonary vasculature and slight indistinctness of the interstitial markings suggestive of mild pulmonary edema. Mild cardiomegaly. Upper mediastinal contours are within normal limits.  IMPRESSION: 1. Support apparatus, as above. Endotracheal tube appears slightly low, only 1.3 cm above the carina, and could be withdrawn 2-3 cm for more optimal placement. 2. Mild interstitial pulmonary edema.   Electronically Signed   By: Trudie Reed M.D.   On: 2013/03/19 09:08     CXR: Support apparatus, as above. Endotracheal tube appears slightly  low, only 1.3 cm above the carina, and could be withdrawn 2-3 cm for  more optimal placement.  2. Mild interstitial pulmonary edema.   ASSESSMENT / PLAN:  PULMONARY A: VDRF secondary to PEA arrest, pulm edema P:   Full Vent Support  Check ABG STAT reviewed, increase MV , when at goal temp  likley will re reduce CXR in am  abg follow up Needs aline UPDATE: hypoxia, increase peep, may need lasix   CARDIOVASCULAR A: Witnessed PEA arrest ( CPR) ,Lactic Acid ~6  Shock ? Cardiogenic \ Concern is MI, cardiogenic shock RBBB- r/o pe, low clinical suspicion HTN , Hyperlipidemia   P:  Hypothermia protocol  Echo pending  I did a limited echo, se rsult CT chest PE protocol /CT head stat  Trend card enzymes/EKG  Titrate Pressors for MAP >85 Place CVL, cvp  needed This likely is NSTEMI, cardiogenic shock, although PEA, consider earlucath Asa after head CT consider hep after head CT  RENAL A:  Await renal failure P:   Check cmet awaited Replace electrolytes as indicated   volume may need reduction as pulm edema  GASTROINTESTINAL A:  Post arrest  P:   Protonix /sup  Npo LFt for shock liver  HEMATOLOGIC A:  S/p arrest P:  Labs pending  Hep /DVT prophy after stat head ct and cardiac anticoag needs discussed Cbc stat   INFECTIOUS A:  cxr w/ no sign of acute infection   P:   Labs pending  Tr temp/wbc  With hypothermia low threhsold abx  ENDOCRINE A:  Hypoglycemia during  code x D 50  P:   Monitor bs closely  Add SSI as needed if glu greater 200 x 2 d5 for fluids  NEUROLOGIC A:  Sedated -s/p PEA arrest  Minimal delay in CPR (x 41m) P:   CT head pending  Sedation on vent for hypothermia paralysis  TODAY'S SUMMARY: 78 yo WM witnessed PEA arrest w/ CPR /ACLS x .  Hypothermia protocol. CT head/chest pending . Likely cardiogenic, MI  I have personally obtained a history, examined the patient, evaluated laboratory and imaging results, formulated the assessment and plan and placed orders. CRITICAL CARE: The patient is critically ill with multiple organ systems failure and requires high complexity decision making for assessment and support, frequent evaluation and titration of therapies, application of advanced monitoring technologies and extensive interpretation of multiple databases. Critical Care Time devoted to patient care services described in this note is 45 minutes.    Son and wife updated  PARRETT,TAMMY NP-C  Pulmonary and Critical Care Medicine [facility] Pager: [phone]  02/22/2013, 9:15 AM  I have fully examined this patient and agree with above findings.    And edited infull  Mcarthur Rossetti. Tyson Alias, MD, FACP Pgr: [phone] [facility]

## 2013-03-06 NOTE — Consult Note (Signed)
CARDIOLOGY CONSULT NOTE   Patient ID: Lucas Frazier MRN: [id number], DOB/AGE: 04-01-1934   Admit date: 03/13/2013 Date of Consult: 02/27/2013   Primary Physician: No primary provider on file. Primary Cardiologist: none known  Pt. Profile   Problem List  Past Medical History  Diagnosis Date  . Cancer   . Skin cancer   . Hypertension   . Diabetes mellitus without complication     History reviewed. No pertinent past surgical history.   Allergies  Not on File  HPI   78 year old male with known hypertension (on triamterene and metoprolol at home) and hyperlipidemia (on pravastatin at home), prior skin cancer who had a witnessed cardiac arrest this morning. He was reading newspaper with his wife when he started to gasp for breath, stiffened up and was non-responsive with eyes wide opened. His wife called 911 and was advised to start CPR that she did with immediate chest compressions and mouth to mouth breathing. Shortly after EMS came and found the patient in PEA arrest. They continued CPR for 20 minutes with 3 epinephrine injections and the patient went into VT from which he was shocked into SR . Upon arrival to the ER he had some spontaneous movement of his extremities.  Based on his family he was very active, with no complains of chest pain or SOB.   Inpatient Medications  . aspirin  300 mg Rectal NOW    Family History History reviewed. No pertinent family history.   Social History History   Social History  . Marital Status: Married    Spouse Name: N/A    Number of Children: N/A  . Years of Education: N/A   Occupational History  . Not on file.   Social History Main Topics  . Smoking status: Not on file  . Smokeless tobacco: Not on file  . Alcohol Use: Not on file  . Drug Use: Not on file  . Sexual Activity: Not on file   Other Topics Concern  . Not on file   Social History Narrative  . No narrative on file     Review of Systems  General:  No  chills, fever, night sweats or weight changes.  Cardiovascular:  No chest pain, dyspnea on exertion, edema, orthopnea, palpitations, paroxysmal nocturnal dyspnea. Dermatological: No rash, lesions/masses Respiratory: No cough, dyspnea Urologic: No hematuria, dysuria Abdominal:   No nausea, vomiting, diarrhea, bright red blood per rectum, melena, or hematemesis Neurologic:  No visual changes, wkns, changes in mental status. All other systems reviewed and are otherwise negative except as noted above.  Physical Exam  Blood pressure 102/70, pulse 58, resp. rate 21, height 5\' 10"  (1.778 m), weight 174 lb (78.926 kg), SpO2 95.00%.  General: Intubated sedated HEENT: Normal, both ears prosthetic   Neck: Supple without bruits or JVD. Lungs:  Resp regular and unlabored, CTA. Heart: distant heart sounds, RRR no s3, s4, or murmurs. Abdomen: Soft, non-tender, non-distended, BS + x 4.  Extremities: No clubbing, cyanosis or edema. DP/PT/Radials 2+ and equal bilaterally.  Labs  No results found for this basename: CKTOTAL, CKMB, TROPONINI,  in the last 72 hours No results found for this basename: WBC, HGB, HCT, MCV, PLT   No results found for this basename: NA, K, CL, CO2, BUN, CREATININE, CALCIUM, LABALBU, PROT, BILITOT, ALKPHOS, ALT, AST, GLUCOSE,  in the last 168 hours No results found for this basename: CHOL, HDL, LDLCALC, TRIG   No results found for this basename: DDIMER   No components  found with this basename: POCBNP,   Radiology/Studies  No results found.  echocardiogram  ECG  SR, left atrial enlargement, RBBB.    ASSESSMENT AND PLAN  78 year old male with h/o HTN and HLP post witnessed  PEA arrest. 20 minutes of CPR. Minimal delay from the arrest to starting CPR.  No ST changes on ECG. If chest CT (possible PE) and brain CT to rule out intracranial hemorrhage are negative he would be a great candidate for hypothermia protocol. We will continue fto follow him closely. We recommend  serial ECGs and cardiac enzymes . Transthoracic echocardiogram ASAP.    Signed, Lars Masson, MD, [facility] 02/19/2013, 9:01 AM

## 2013-03-06 NOTE — Procedures (Signed)
Limited echo  1. rv kinetic, mild dilation 2. LV fxn down concern is apex out of proportion down to base 3. No sig effusion  Mcarthur Rossetti. Tyson Alias, MD, FACP Pgr: [phone] [facility]

## 2013-03-06 NOTE — Progress Notes (Signed)
  Echocardiogram 2D Echocardiogram has been performed.  Lucas Frazier 02/24/2013, 10:26 AM

## 2013-03-06 NOTE — Progress Notes (Signed)
CBG value discussed w/Dr Tyson Alias. No coverage to be done at this time . SSI to be initiated w/1600 p CBG value.

## 2013-03-06 NOTE — Procedures (Signed)
Additional ccm time  Limited echo my read confirmed by cards CT bases infil / atx Add unasyn, family updated Sputum bc D/w cards Jonne Ply Consider cath now  Total ccm tim90 min  Mcarthur Rossetti. Tyson Alias, MD, FACP Pgr: [phone] [place] [facility]

## 2013-03-06 NOTE — ED Notes (Signed)
Central line being placed

## 2013-03-06 NOTE — ED Notes (Signed)
Pt arrived by [facility], post cpr. Was sitting with family, they reported he took a deep breath and slumped over, PEA on fire and ems arrival, given 4 epi and d50 pta, shocked x 2 for vtach. King airway placed pta and cold saline started.

## 2013-03-06 NOTE — Procedures (Signed)
Central Venous Catheter Insertion Procedure Note TONG PIECZYNSKI [id number] 1934-09-02  Procedure: Insertion of Central Venous Catheter Indications: Assessment of intravascular volume, Drug and/or fluid administration and Frequent blood sampling  Procedure Details Consent: Risks of procedure as well as the alternatives and risks of each were explained to the (patient/caregiver).  Consent for procedure obtained. Time Out: Verified patient identification, verified procedure, site/side was marked, verified correct patient position, special equipment/implants available, medications/allergies/relevent history reviewed, required imaging and test results available.  Performed  Maximum sterile technique was used including antiseptics, cap, gloves, gown, hand hygiene, mask and sheet. Skin prep: Chlorhexidine; local anesthetic administered A antimicrobial bonded/coated triple lumen catheter was placed in the right internal jugular vein using the Seldinger technique.  Evaluation Blood flow good Complications: No apparent complications Patient did tolerate procedure well. Chest X-ray ordered to verify placement.  CXR: pending  [place] gudiance .  Nelda Bucks 03/03/2013, 10:06 AM  Mcarthur Rossetti. Tyson Alias, MD, FACP Pgr: [phone] [place]

## 2013-03-06 NOTE — ED Notes (Signed)
Pt was taken to ct scan with rn present and then to 2900. Report given to [place], rn

## 2013-03-06 NOTE — Progress Notes (Signed)
ANTICOAGULATION CONSULT NOTE - Initial Consult  Pharmacy Consult for Heparin Indication: IABP  No Known Allergies  Patient Measurements: Height: 5\' 10"  (177.8 cm) Weight: 174 lb (78.926 kg) IBW/kg (Calculated) : 73   Vital Signs: Temp: 92.1 F (33.4 C) (12/21 1000) Temp src: Core (Comment) (12/21 1000) BP: 132/69 mmHg (12/21 1010) Pulse Rate: 35 (12/21 1135)   Labs:  Recent Labs  02/25/2013 0836 03/05/2013 0932 02/22/2013 0934  HGB 12.8* 13.6  --   HCT 38.5* 40.1  --   PLT 173 201  --   APTT  --  30  --   LABPROT  --  14.9  --   INR  --  1.20  --   CREATININE 1.14 0.91  --   TROPONINI  --   --  8.05*    Estimated Creatinine Clearance: 71.3 ml/min (by C-G formula based on Cr of 0.91).   Medical History: Past Medical History  Diagnosis Date  . Cancer   . Skin cancer   . Hypertension   . Diabetes mellitus without complication   . Acute myocardial infarction of other anterior wall, initial episode of care   . Cardiogenic shock   . Bradycardia     Assessment: 78 year old male admitted with witnessed PEA arrest with 30 minutes of CPR / ACLS.  Hypothermia protocol initiated and taken urgently to the cath lab.  Now s/p stent with IABP and temporary pacer in place.  Pharmacy asked to dose heparin.  Goal of Therapy:  Heparin level 0.2-0.5 units/ml Monitor platelets by anticoagulation protocol: Yes   Plan:  1) Heparin drip at 900 units / hr 2) 8 hour heparin level 3) Daily heparin level, CBC 4) Also on Aggrastat  Thank you. Okey Regal, PharmD [phone]  03/14/2013,2:25 PM

## 2013-03-06 NOTE — Progress Notes (Signed)
Dr Tobias Alexander at bedside.IV meds,VS , balloon pump readings(vary much from art line values) discussed.

## 2013-03-06 NOTE — Progress Notes (Signed)
E-Link Dr aware of VS, IV med gtts ...dr Eldridge Dace to be paged

## 2013-03-06 NOTE — ED Notes (Signed)
istat lactic acid shown to Dr Fayrene Fearing and Johnston Ebbs, RN

## 2013-03-06 NOTE — Progress Notes (Signed)
ANTIBIOTIC CONSULT NOTE - INITIAL  Pharmacy Consult for Unasyn Indication: rule out pneumonia  No Known Allergies  Patient Measurements: Height: 5\' 10"  (177.8 cm) Weight: 174 lb (78.926 kg) IBW/kg (Calculated) : 73  Vital Signs: Temp: 92.1 F (33.4 C) (12/21 1000) Temp src: Core (Comment) (12/21 1000) BP: 132/69 mmHg (12/21 1010) Pulse Rate: 70 (12/21 1010) Intake/Output from previous day:   Intake/Output from this shift: Total I/O In: 3000 [I.V.:3000] Out: 325 [Urine:325]  Labs:  Recent Labs  14-Mar-2013 0836  WBC 11.4*  HGB 12.8*  PLT 173  CREATININE 1.14   Estimated Creatinine Clearance: 56.9 ml/min (by C-G formula based on Cr of 1.14). No results found for this basename: VANCOTROUGH, VANCOPEAK, VANCORANDOM, GENTTROUGH, GENTPEAK, GENTRANDOM, TOBRATROUGH, TOBRAPEAK, TOBRARND, AMIKACINPEAK, AMIKACINTROU, AMIKACIN,  in the last 72 hours   Microbiology: No results found for this or any previous visit (from the past 720 hour(s)).  Medical History: Past Medical History  Diagnosis Date  . Cancer   . Skin cancer   . Hypertension   . Diabetes mellitus without complication     Assessment: 78 year old male undergoing hypothermia protocol s/p PEA arrest x 20 minutes.  To begin Unasyn for empiric coverage.  Taken urgently to the cath lab now.  Renal function stable  Goal of Therapy:  Appropriate dosing  Plan:  1) Unasyn 3 grams iv Q 6 hours 2) Continue to follow  Thank you. Okey Regal, PharmD [phone]  03/14/2013,11:20 AM

## 2013-03-06 NOTE — ED Notes (Signed)
Dr Tyson Alias is aware of low spo2, has ordered bedside echo. Pt is not cooled yet, had ordered for pt to go to ct scan, straight to 2900 and then start cooling.

## 2013-03-06 NOTE — Progress Notes (Signed)
  Echocardiogram 2D Echocardiogram has been performed.  [place] 02/16/2013, 5:04 PM

## 2013-03-06 NOTE — Progress Notes (Signed)
Chaplain offered spiritual care and emotional support to the patient's family. Patient's family said they had no pastoral needs and chaplain services were not needed at this time. Chaplain asked ED nurse to call if the family changed their minds.   02/19/2013 0800  Clinical Encounter Type  Visited With Family;Health care provider  Visit Type Initial;Other (Comment) (CPR in progress)

## 2013-03-06 NOTE — Progress Notes (Signed)
Post ABG on 600, 25, 100, 10- results called to MD at [facility] and is satisfied with results and vent settings at this time.RN aware.

## 2013-03-06 NOTE — ED Notes (Signed)
Clean gown placed on pt

## 2013-03-06 NOTE — ED Notes (Signed)
Critical care at bedside  

## 2013-03-06 NOTE — ED Notes (Signed)
Pt to go to ct scan and then straight to 2900, per md order will begin cooling when on unit.

## 2013-03-06 NOTE — ED Provider Notes (Signed)
CSN: [id number]     Arrival date & time 03-19-2013  0807 History   First MD Initiated Contact with Patient 2013/03/19 0830     Chief Complaint  Patient presents with  . Cardiac Arrest    HPI  This is an 78-year-old male who presents status post cardiac arrest at home. He was sitting with his wife. He is across a table from her eating breakfast. The wife states that his arms when out, his head went back, his eyes were wide open and he made a large grunting sound. He seemed to be stiff and slowly slumped to his right. She grabbed him. She helped him to the floor. He was not breathing or responding. She called 911. She started giving chest compressions. EMS had a 4-5 in response time. The first responders arrived to find him pulseless apneic and unresponsive. A Combitube was placed. He was placed onan AED.  AED delivered one shock. Approximately 4 minutes later paramedics arrived. He is placed on the paramedics monitor. He was in pulseless like activity. Chest compressions continued. His given epinephrine total of 4 mg in route with  1 mg increments. Rhythm changed to a ventricular tachycardic rhythm. They did administer one 300 J shock. They continued CPR for 3 minutes. Post shock rhythm check: he had a palpable pulse in a sinus rhythm. He maintained this the remainder of his prehospital course. Initial event and called EMS was 7:30. He arrives here approximately 8:08 AM. Known history of hypertension. 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 Strong family history for sudden cardiac death. No personal history of sudden cardiac death. The risk factors for PE with recent prolonged immobilization cast splints fractures surgeries or malignancies.  Past Medical History  Diagnosis Date  . Cancer   . Skin cancer   . Hypertension   . Diabetes mellitus without complication    History reviewed. No pertinent past surgical history. History reviewed. No pertinent family history. History   Substance Use Topics  . Smoking status: Not on file  . Smokeless tobacco: Not on file  . Alcohol Use: Not on file    Review of Systems  Unable to perform ROS: Intubated    Allergies  Review of patient's allergies indicates not on file.  Home Medications  No current outpatient prescriptions on file. BP 132/69  Pulse 70  Temp(Src) 92.1 F (33.4 C) (Core (Comment))  Resp 24  Ht 5\' 10"  (1.778 m)  Wt 174 lb (78.926 kg)  BMI 24.97 kg/m2  SpO2 90% Physical Exam  Constitutional:  Adult male supine on a spine board. Chest compression device in place. Removed upon his arrival.  HENT:  Bilateral prosthetic years. Combitube in the mouth.  Eyes:  3 mm symmetric pupils  Neck:  No JVD  Cardiovascular: Exam reveals no S3 and no S4.   Sinus rhythm. Palpable femoral pulses and regular.  Pulmonary/Chest:  Rhonchorous bilateral breath sounds  Abdominal:  Abdomen soft. Epigastric distention improves after NG tube placement  Genitourinary:  Normal genitalia  Neurological:  ECS 5. He does seem to reach toward painful stimulus, vs Decorticate flexion.  He does have a cough and a gag.  Skin:  Skin not pale. No rash    ED Course  CRITICAL CARE Performed by: Roney Marion Authorized by: Rolland Porter J Total critical care time: 45 minutes Critical care start time: 03/19/13 8:10 AM Critical care end time: March 19, 2013 8:55 AM Critical care time was exclusive of separately billable procedures and treating other patients.  Critical care was necessary to treat or prevent imminent or life-threatening deterioration of the following conditions: cardiac failure and circulatory failure. Critical care was time spent personally by me on the following activities: blood draw for specimens, development of treatment plan with patient or surrogate, discussions with consultants, evaluation of patient's response to treatment, examination of patient, obtaining history from patient or surrogate, ordering and  performing treatments and interventions, ordering and review of laboratory studies, ordering and review of radiographic studies, pulse oximetry and re-evaluation of patient's condition. Subsequent provider of critical care: I assumed direction of critical care for this patient from another provider of my specialty.  INTUBATION Date/Time: 03/03/2013 8:30 AM Performed by: Roney Marion Authorized by: Rolland Porter J Consent: The procedure was performed in an emergent situation. Patient identity confirmed: arm band Intubation method: direct Laryngoscope size: Miller 3 Tube size: 8.0 mm Tube type: cuffed Number of attempts: 1 Cricoid pressure: yes Cords visualized: yes Post-procedure assessment: chest rise,  ETCO2 monitor and CO2 detector Breath sounds: equal Cuff inflated: yes ETT to lip: 24 cm Chest x-ray interpreted by me. Chest x-ray findings: endotracheal tube too low Tube repositioned: tube repositioned successfully Patient tolerance: Patient tolerated the procedure well with no immediate complications.   (including critical care time) Labs Review Labs Reviewed  CBC WITH DIFFERENTIAL - Abnormal; Notable for the following:    WBC 11.4 (*)    Hemoglobin 12.8 (*)    HCT 38.5 (*)    Lymphs Abs 4.2 (*)    All other components within normal limits  COMPREHENSIVE METABOLIC PANEL - Abnormal; Notable for the following:    Potassium 3.1 (*)    CO2 17 (*)    Glucose, Bld 374 (*)    BUN 26 (*)    Calcium 7.4 (*)    Total Protein 5.3 (*)    Albumin 2.9 (*)    AST 173 (*)    ALT 181 (*)    GFR calc non Af Amer 61 (*)    GFR calc Af Amer 70 (*)    All other components within normal limits  POCT I-STAT 3, BLOOD GAS (G3+) - Abnormal; Notable for the following:    pH, Arterial 7.233 (*)    pO2, Arterial 101.0 (*)    Bicarbonate 17.5 (*)    Acid-base deficit 10.0 (*)    All other components within normal limits  CG4 I-STAT (LACTIC ACID) - Abnormal; Notable for the following:     Lactic Acid, Venous 6.46 (*)    All other components within normal limits  POCT I-STAT TROPONIN I - Abnormal; Notable for the following:    Troponin i, poc 0.18 (*)    All other components within normal limits  POCT I-STAT 3, BLOOD GAS (G3+) - Abnormal; Notable for the following:    pH, Arterial 7.322 (*)    pCO2 arterial 31.1 (*)    pO2, Arterial 57.0 (*)    Bicarbonate 16.8 (*)    Acid-base deficit 9.0 (*)    All other components within normal limits  CULTURE, BLOOD (ROUTINE X 2)  CULTURE, BLOOD (ROUTINE X 2)  CULTURE, EXPECTORATED SPUTUM-ASSESSMENT  CULTURE, RESPIRATORY (NON-EXPECTORATED)  MRSA PCR SCREENING  MAGNESIUM  BLOOD GAS, ARTERIAL  BLOOD GAS, ARTERIAL  CBC  CREATININE, SERUM  PHOSPHORUS  TROPONIN I  TROPONIN I  TROPONIN I  LACTIC ACID, PLASMA  PROCALCITONIN  PRO B NATRIURETIC PEPTIDE  CORTISOL  PROTIME-INR  APTT  URINALYSIS, ROUTINE W REFLEX MICROSCOPIC  BLOOD GAS, ARTERIAL  Imaging Review Ct Head Wo Contrast  02/19/2013   CLINICAL DATA:  Syncope.  History of PEA arrest.  EXAM: CT HEAD WITHOUT CONTRAST  TECHNIQUE: Contiguous axial images were obtained from the base of the skull through the vertex without intravenous contrast.  COMPARISON:  No priors.  FINDINGS: Mild cerebral and cerebellar atrophy is age appropriate. No acute intracranial abnormalities. Specifically, no evidence of acute intracranial hemorrhage, no definite findings of acute/subacute cerebral ischemia, no mass, mass effect, hydrocephalus or abnormal intra or extra-axial fluid collections. Visualized paranasal sinuses and mastoids are generally well pneumatized, with exception of some mild multifocal mucosal thickening throughout the ethmoid and sphenoid sinuses bilaterally. No acute displaced skull fractures are identified.  IMPRESSION: 1. No acute intracranial abnormalities. 2. Mild cerebral and cerebellar atrophy. 3. Mild paranasal sinus disease, as above, without acute features.   Electronically  Signed   By: Trudie Reed M.D.   On: 03/09/2013 10:43   Ct Angio Chest Pe W/cm &/or Wo Cm  02/15/2013   CLINICAL DATA:  Syncope.  PEA arrest.  EXAM: CT ANGIOGRAPHY CHEST WITH CONTRAST  TECHNIQUE: Multidetector CT imaging of the chest was performed using the standard protocol during bolus administration of intravenous contrast. Multiplanar CT image reconstructions including MIPs were obtained to evaluate the vascular anatomy.  CONTRAST:  80mL OMNIPAQUE IOHEXOL 350 MG/ML SOLN  COMPARISON:  No priors.  FINDINGS: Mediastinum: There are no well-defined centrally located filling defects within the pulmonary arteries to suggest acute pulmonary embolism. Within several lower lobe branches in the lungs bilaterally there are some very ill-defined eccentric filling defects, which are favored to represent artifact related to decreased flow (less likely, these could represent areas of chronic thromboembolic disease). Heart size is normal, however, there is a rounding of the apex of the left ventricle (best demonstrated on images 73-75 of series 5) which is unusual, and can be seen in the setting of chronic aneurysm or acute ischemia with acute wall motion abnormality. There is no significant pericardial fluid, thickening or pericardial calcification. There is atherosclerosis of the thoracic aorta, the great vessels of the mediastinum and the coronary arteries, including calcified atherosclerotic plaque in the left main, left anterior descending, left circumflex and right coronary arteries. No pathologically enlarged mediastinal or hilar lymph nodes. Esophagus is unremarkable in appearance. A nasogastric tube extends into the stomach. Patient is intubated, with the tip of the endotracheal tube in the trachea at the level of the aortic arch.  Lungs/Pleura: Extensive airspace consolidation is seen throughout the lower lobes of the lungs bilaterally, as well as the dependent portions of the remaining lobes of the lungs  bilaterally, suggesting massive aspiration with severe aspiration pneumonia. No pleural effusions.  Upper Abdomen: Unremarkable.  Musculoskeletal: There are no aggressive appearing lytic or blastic lesions noted in the visualized portions of the skeleton.  Review of the MIP images confirms the above findings.  IMPRESSION: 1. No findings to suggest acute pulmonary embolism on today's examination. There are some ill-defined peripheral filling defects within several pulmonary artery branches to the lower lobes of the lungs bilaterally, which are strongly favored to represent artifact related to sluggish flow in these regions, likely secondary to hypoxic pulmonary vasoconstriction in the setting of severe aspiration pneumonia (lower lobes of the lungs are nearly completely consolidated). These findings may less likely represent sequela of broad chronic thromboembolic disease, but this is not favored. 2. Atherosclerosis, including left main and 3 vessel coronary artery disease. Importantly, the apex of the left ventricle has a  very rounded appearance. In the setting of acute chest pain, this could indicate acute LAD territory ischemia, but may alternatively represent a small chronic left ventricular apical aneurysm. Clinical correlation is recommended. 3. Extensive dependent airspace disease throughout the lungs bilaterally compatible with massive aspiration and severe aspiration pneumonia. 4. Additional findings, as above. These results were called by telephone at the time of interpretation on 02/15/2013 at 11:00 AM to Dr. Rolland Porter, who verbally acknowledged these results.   Electronically Signed   By: Trudie Reed M.D.   On: 03/09/2013 11:00   Dg Chest Portable 1 View  02/23/2013   CLINICAL DATA:  Cardiac arrest.  Intubation.  EXAM: PORTABLE CHEST - 1 VIEW  COMPARISON:  02/17/2013.  FINDINGS: Endotracheal tube tip is noted at the carina, proximal repositioning suggested. Right IJ line noted with tip in the  upper portion of the right atrium. NG tube noted with its tip below the left hemidiaphragm. Defibrillator pads are noted. Bilateral pulmonary alveolar infiltrates consistent with pulmonary edema. No pleural effusion or pneumothorax. Cardiomegaly with pulmonary venous congestion is present.  IMPRESSION: 1. Endotracheal tube tip is at the carina, proximal repositioning suggested. 2. Congestive heart failure with bilateral pulmonary edema. These results were called by telephone at the time of interpretation on 02/21/2013 at 10:00 AM to Dr. Rory Percy , who verbally acknowledged these results.   Electronically Signed   By: Maisie Fus  Register   On: 02/17/2013 10:01   Dg Chest Port 1 View  03/05/2013   CLINICAL DATA:  Evaluate endotracheal tube placement.  EXAM: PORTABLE CHEST - 1 VIEW  COMPARISON:  Chest x-ray 03/05/2013.  FINDINGS: An endotracheal tube is in place with tip 1.3 cm above the carina. Nasogastric tube extends into the proximal stomach, with side port distal to the gastroesophageal junction. Transcutaneous defibrillator pads are seen projecting over the left hemithorax and left upper quadrant of the abdomen. Multiple surgical clips are seen projecting over the right axillary region and left hilar region. Lung volumes are low. There is cephalization of the pulmonary vasculature and slight indistinctness of the interstitial markings suggestive of mild pulmonary edema. Mild cardiomegaly. Upper mediastinal contours are within normal limits.  IMPRESSION: 1. Support apparatus, as above. Endotracheal tube appears slightly low, only 1.3 cm above the carina, and could be withdrawn 2-3 cm for more optimal placement. 2. Mild interstitial pulmonary edema.   Electronically Signed   By: Trudie Reed M.D.   On: 03/05/2013 09:08    EKG Interpretation    Date/Time:    Ventricular Rate:    PR Interval:    QRS Duration:   QT Interval:    QTC Calculation:   R Axis:     Text Interpretation:               MDM   1. Cardiac arrest   2. Hyperlipidemia   3. Hypertension    ED course. He arrives a spontaneous pulses in sinus rhythm. EKG shows right bundle branch block. No ST or T wave changes. His endotracheal tube is changed from a Combitube to an endotracheal tube without difficulty. Cardiac enzymes show troponin elevated at 0.18. After his initial arrival in presentation and my exam I placed a call to critical care. Dr. Tyson Alias is here evaluating the patient. Please call cardiology. Dr. Delton See is here evaluating the patient. CT scan of his head was obtained CT of the chest was obtained. He was in a pulseless looks like activity and thus I felt it was best  to rule out PE. Currently he is en route to the cath lab with Dr. Delton See for cardiac cath..  This was a witnessed arrest with signs of neurological activity. It initiates hypothermia protocol.    Roney Marion, MD 03/02/2013 [phone]

## 2013-03-07 ENCOUNTER — Inpatient Hospital Stay (HOSPITAL_COMMUNITY): Payer: Medicare Other

## 2013-03-07 LAB — POCT ACTIVATED CLOTTING TIME: Activated Clotting Time: 243 seconds

## 2013-03-07 LAB — BLOOD GAS, ARTERIAL
Bicarbonate: 12.5 mEq/L — ABNORMAL LOW (ref 20.0–24.0)
Drawn by: 39866
FIO2: 1 %
MECHVT: 600 mL
PEEP: 10 cmH2O
RATE: 25 resp/min
pCO2 arterial: 38.4 mmHg (ref 35.0–45.0)
pH, Arterial: 7.109 — CL (ref 7.350–7.450)

## 2013-03-07 LAB — POCT I-STAT, CHEM 8
BUN: 19 mg/dL (ref 6–23)
BUN: 23 mg/dL (ref 6–23)
Calcium, Ion: 1.02 mmol/L — ABNORMAL LOW (ref 1.13–1.30)
Chloride: 116 mEq/L — ABNORMAL HIGH (ref 96–112)
Chloride: 118 mEq/L — ABNORMAL HIGH (ref 96–112)
Creatinine, Ser: 0.8 mg/dL (ref 0.50–1.35)
Glucose, Bld: 259 mg/dL — ABNORMAL HIGH (ref 70–99)
Glucose, Bld: 269 mg/dL — ABNORMAL HIGH (ref 70–99)
HCT: 37 % — ABNORMAL LOW (ref 39.0–52.0)
Hemoglobin: 12.6 g/dL — ABNORMAL LOW (ref 13.0–17.0)
Hemoglobin: 12.9 g/dL — ABNORMAL LOW (ref 13.0–17.0)
Potassium: 3.5 mEq/L (ref 3.5–5.1)
Sodium: 141 mEq/L (ref 135–145)
Sodium: 141 mEq/L (ref 135–145)
TCO2: 14 mmol/L (ref 0–100)

## 2013-03-07 LAB — CBC
HCT: 37.8 % — ABNORMAL LOW (ref 39.0–52.0)
MCHC: 33.3 g/dL (ref 30.0–36.0)
MCV: 85.9 fL (ref 78.0–100.0)
Platelets: 205 10*3/uL (ref 150–400)
RBC: 4.4 MIL/uL (ref 4.22–5.81)
RDW: 13.9 % (ref 11.5–15.5)
WBC: 5.9 10*3/uL (ref 4.0–10.5)

## 2013-03-07 LAB — GLUCOSE, CAPILLARY
Glucose-Capillary: 226 mg/dL — ABNORMAL HIGH (ref 70–99)
Glucose-Capillary: 219 mg/dL — ABNORMAL HIGH (ref 70–99)
Glucose-Capillary: 225 mg/dL — ABNORMAL HIGH (ref 70–99)
Glucose-Capillary: 231 mg/dL — ABNORMAL HIGH (ref 70–99)
Glucose-Capillary: 202 mg/dL — ABNORMAL HIGH (ref 70–99)
Glucose-Capillary: 264 mg/dL — ABNORMAL HIGH (ref 70–99)

## 2013-03-07 LAB — MAGNESIUM: Magnesium: 1.5 mg/dL (ref 1.5–2.5)

## 2013-03-07 LAB — HEPARIN LEVEL (UNFRACTIONATED): Heparin Unfractionated: 1.14 IU/mL — ABNORMAL HIGH (ref 0.30–0.70)

## 2013-03-07 LAB — COMPREHENSIVE METABOLIC PANEL
ALT: 160 U/L — ABNORMAL HIGH (ref 0–53)
AST: 276 U/L — ABNORMAL HIGH (ref 0–37)
Albumin: 2.1 g/dL — ABNORMAL LOW (ref 3.5–5.2)
Alkaline Phosphatase: 39 U/L (ref 39–117)
BUN: 23 mg/dL (ref 6–23)
Creatinine, Ser: 1.09 mg/dL (ref 0.50–1.35)
Potassium: 3.6 mEq/L (ref 3.5–5.1)
Total Protein: 4.2 g/dL — ABNORMAL LOW (ref 6.0–8.3)

## 2013-03-07 LAB — POCT I-STAT 3, ART BLOOD GAS (G3+)
pCO2 arterial: 42.4 mmHg (ref 35.0–45.0)
pO2, Arterial: 36 mmHg — CL (ref 80.0–100.0)

## 2013-03-07 LAB — PHOSPHORUS: Phosphorus: 2.5 mg/dL (ref 2.3–4.6)

## 2013-03-07 MED ORDER — HEPARIN (PORCINE) IN NACL 100-0.45 UNIT/ML-% IJ SOLN
600.0000 [IU]/h | INTRAMUSCULAR | Status: DC
  Filled 2013-03-07: qty 250

## 2013-03-07 MED ORDER — SODIUM CHLORIDE 0.9 % IV BOLUS (SEPSIS): 750.0000 mL | Freq: Once | INTRAVENOUS | Status: DC

## 2013-03-07 MED ORDER — VASOPRESSIN 20 UNIT/ML IJ SOLN
0.0100 [IU]/min | INTRAVENOUS | Status: DC
  Administered 2013-03-07: 0.03 [IU]/min via INTRAVENOUS
  Filled 2013-03-07: qty 2.5

## 2013-03-07 MED ORDER — SODIUM BICARBONATE 8.4 % IV SOLN
INTRAVENOUS | Status: AC
  Administered 2013-03-07: 100 meq via INTRAVENOUS
  Filled 2013-03-07: qty 50

## 2013-03-07 MED ORDER — SODIUM BICARBONATE 8.4 % IV SOLN
INTRAVENOUS | Status: DC
  Administered 2013-03-07: 05:00:00 via INTRAVENOUS
  Filled 2013-03-07 (×2): qty 150

## 2013-03-07 MED ORDER — SODIUM BICARBONATE 8.4 % IV SOLN
50.0000 meq | Freq: Once | INTRAVENOUS | Status: DC
  Filled 2013-03-07: qty 50

## 2013-03-07 MED ORDER — SODIUM BICARBONATE 8.4 % IV SOLN
50.0000 meq | Freq: Once | INTRAVENOUS | Status: AC
  Administered 2013-03-07: 50 meq via INTRAVENOUS

## 2013-03-07 MED ORDER — SODIUM BICARBONATE 8.4 % IV SOLN
100.0000 meq | Freq: Once | INTRAVENOUS | Status: DC
  Filled 2013-03-07: qty 100

## 2013-03-07 MED ORDER — SODIUM BICARBONATE 8.4 % IV SOLN
100.0000 meq | Freq: Once | INTRAVENOUS | Status: AC
  Administered 2013-03-07: 100 meq via INTRAVENOUS

## 2013-03-07 MED ORDER — SODIUM BICARBONATE 8.4 % IV SOLN
INTRAVENOUS | Status: AC
  Filled 2013-03-07: qty 50

## 2013-03-07 MED ORDER — DOPAMINE-DEXTROSE 3.2-5 MG/ML-% IV SOLN
2.0000 ug/kg/min | INTRAVENOUS | Status: DC
  Administered 2013-03-07: 3 ug/kg/min via INTRAVENOUS

## 2013-03-07 MED ORDER — MILRINONE IN DEXTROSE 20 MG/100ML IV SOLN: 0.2500 ug/kg/min | INTRAVENOUS | Status: DC

## 2013-03-07 MED ORDER — DOPAMINE-DEXTROSE 3.2-5 MG/ML-% IV SOLN
INTRAVENOUS | Status: AC
  Filled 2013-03-07: qty 250

## 2013-03-07 MED FILL — Medication: Qty: 1 | Status: AC

## 2013-03-12 LAB — CULTURE, BLOOD (ROUTINE X 2)
Culture: NO GROWTH
Culture: NO GROWTH

## 2013-03-17 NOTE — Progress Notes (Signed)
Called dr. Donnie Aho about patient's active bleeding and that heparin and aggrastat had been turned off at this time per dr. Evlyn Courier order. Jorge [place]

## 2013-03-17 NOTE — Significant Event (Signed)
Pt with hypotension.  Admitted with PEA, and s/p cardiac stenting.  Likely has aspiration pneumonia and ? Sepsis. Will give fluid bolus, decrease PEEP, and add vasopressin.  Will also repeat labs now.  Coralyn Helling, MD 02/14/2013, 4:22 AM

## 2013-03-17 NOTE — Significant Event (Signed)
Difficulty with oxygenation and ventilation.  Noted to have blood from ETT >> heparin and brilinta held.  CXR shows diffuse b/l infiltrates.  Changed to ARDS protocol.  Coralyn Helling, MD 03/14/2013, 5:41 AM

## 2013-03-17 NOTE — Progress Notes (Signed)
Patient BP dropping.  Titrating levophed to maintain BP.  Unable to maintain required MAP 85 for hypothermia protocol.  Dr. Craige Cotta notified of all parameters.  Continuing to titrate Levophed.  Continue to monitor.

## 2013-03-17 NOTE — Progress Notes (Addendum)
Patient taken off vent per RT to bag on 100% O2 per order.  Approximately 100 bright red blood sprayed from ET-Tube.  Dr. Craige Cotta notified.  RT Continue to bag on 100%.  Unable to maintain O2 Sats on Vent.   Continue titrating medications despite no improvement in hemodynamics. Requiring bicarb pushes and bagging on 100% O2 to maintain BP.  Dr. Craige Cotta is aware of all parameters.    Heparin and Aggrastat Dc'd per order Dr. Donnie Aho.

## 2013-03-17 NOTE — Procedures (Signed)
Extubation Procedure Note  Patient Details:   Name: Lucas Frazier DOB: 1934-11-24 MRN: [id number]   Airway Documentation:  Airway 7.5 mm (Active)  Secured at (cm) 23 cm 2013-03-30  5:45 AM  Measured From Lips 03-30-2013  5:45 AM  Secured Location Center 2013-03-30  5:45 AM  Secured By [employer] 03/30/13  5:45 AM  Tube Holder Repositioned Yes 03-30-2013  5:45 AM  Cuff Pressure (cm H2O) 25 cm H2O 2013/03/30  4:31 AM  Site Condition Dry 03-30-2013  5:45 AM    Evaluation  O2 sats: transiently fell during during procedure Complications: No apparent complications Patient did tolerate procedure well. Bilateral Breath Sounds: Clear;Diminished   No  Terminal extubation per verbal MD order. Extubation at 06:20.   Gaetano Hawthorne 30-Mar-2013, 7:22 AM

## 2013-03-17 NOTE — Progress Notes (Signed)
ABG obtained at 04:20 was 7.10/38/44/12.5 Orders of PEEP +12 and rate 35 and bicarb. Pt continued to desat despite vent changes. Pt was bagged with a PEEP valve of +15 with sats of 90%. Pt was placed back on the ventilator. Sats dropped again and pt was bagged with PEEP valve of +20. When placed back on the ventilator sats continued to fall. Recruitment maneuver was performed for 2 min on settings of PC 10/+20/R10/100%. Pt did not tolerate. Various ventilator modes were explored with none yielding any increase in sats. Approximately 75cc of bright red blood was suctioned from the pt's ETT during this time span. Pt was bagged until MD ordered to place back on ventilator for comfort care. Pt was subsequently terminally extubated at 0620.

## 2013-03-17 NOTE — Progress Notes (Signed)
BP continuing to fall.  Titrating Levophed.  Dr. Craige Cotta notified.

## 2013-03-17 NOTE — Significant Event (Signed)
ABG    Component Value Date/Time   PHART 7.109* 03/04/2013 0420   PCO2ART 38.4 03/12/2013 0420   PO2ART 44.0* 02/15/2013 0420   HCO3 12.5* 02/27/2013 0420   TCO2 13.9 03/05/2013 0420   ACIDBASEDEF 15.8* 02/15/2013 0420   O2SAT 77.0 02/28/2013 0420    Will increase PEEP to 5, increase RR, and add HCO3 gtt.  Will repeat ABG.  Coralyn Helling, MD 03/15/2013, 4:45 AM

## 2013-03-17 NOTE — Progress Notes (Signed)
Dr. Gala Romney at bedside.  Patient made comfort care.  Patient extubated per RT.  All pressors dc'd.  Fentanyl and Versed increased for comfort. Wife and son at bedside.  Emotional support to patient. [id number]:  Patient pronounced per Dr. Delford Field.  Wife and son expressed gratitude for care.  Emotional support to family.

## 2013-03-17 NOTE — H&P (Signed)
Name: Lucas Frazier MRN: [id number] DOB: 07-16-1934    ADMISSION DATE:  03/15/2013  REFERRING MD :  EDP  PRIMARY SERVICE: PCCM   CHIEF COMPLAINT:  PEA arrest consult   BRIEF PATIENT DESCRIPTION: 79 yo WM witnessed PEA arrest with 30 min of CPR/ACLS . Hypothermia protocol initiated. CT chest /head pending   SIGNIFICANT EVENTS / STUDIES:  12/21 Witnessed PEA arrest -20 min CPR/ACLS   LINES / TUBES: 12/21 ETT>> 12/21 R IJ >>  CULTURES:  ANTIBIOTICS:  HISTORY OF PRESENT ILLNESS:   79 year old male with known hypertension (on triamterene and metoprolol at home) and hyperlipidemia (on pravastatin at home), prior skin cancer who had a witnessed cardiac arrest on 12/21 at home.  He was reading newspaper with his wife when he started to gasp for breath, stiffened up and was non-responsive with eyes wide opened. His wife called 911 and was advised to start CPR that she did with immediate chest compressions and mouth to mouth breathing. Shortly after EMS came and found the patient in PEA arrest. They continued CPR for 20 minutes with 3 epinephrine injections and the patient went into VT from which he was shocked into SR . Received 3 epi . 1 D50 . IVF x 1 L infused . Upon arrival to the ER he had some spontaneous movement of his extremities. Based on his family he was very active, with no complains of chest pain or SOB . PCCM asked to admit. Pt is intubated on vent . Awaiting CT head and chest -PE protocol.  All labs are pending . EKG with R. BBB  Hypotensive started on pressors .      SUBJECTIVE:  PEA arrest . Now refractory to all current interventions.  VITAL SIGNS: Temp:  [90.5 F (32.5 C)-92.1 F (33.4 C)] 91.8 F (33.2 C) (12/22 0500) Pulse Rate:  [29-87] 87 (12/22 0530) Resp:  [12-30] 30 (12/22 0530) BP: (92-161)/(28-71) 110/54 mmHg (12/22 0530) SpO2:  [78 %-100 %] 85 % (12/22 0530) Arterial Line BP: (88-181)/(24-58) 104/43 mmHg (12/22 0530) FiO2 (%):  [75 %-100 %] 100 %  (12/22 0445) Weight:  [174 lb (78.926 kg)] 174 lb (78.926 kg) (12/21 0845) HEMODYNAMICS: CVP:  [6 mmHg-12 mmHg] 9 mmHg VENTILATOR SETTINGS: Vent Mode:  [-] PRVC FiO2 (%):  [75 %-100 %] 100 % Set Rate:  [15 bmp-30 bmp] 30 bmp Vt Set:  [580 mL-600 mL] 600 mL PEEP:  [5 cmH20-12 cmH20] 12 cmH20 Plateau Pressure:  [13 cmH20-26 cmH20] 24 cmH20 INTAKE / OUTPUT: Intake/Output     12/21 0701 - 12/22 0700   I.V. (mL/kg) 5389.2 (68.3)   IV Piggyback 550   Total Intake(mL/kg) 5939.2 (75.3)   Urine (mL/kg/hr) 1480   Total Output 1480   Net +4459.2         PHYSICAL EXAMINATION: General:  Sedated/NMB on vent  Neuro:  Sedated /NMB HEENT:  ETT , + jvd Cardiovascular:  NSR , IABP sounds noted Lungs:  Coarse crackles bilaterally  Abdomen:  Soft , BS hypoactive, lerge inguinal hernia left, non reducinble, involves scrotum , not warm or hot Musculoskeletal:  Intact  Skin:  Intact w/o rash   LABS:  CBC  Recent Labs Lab 02/15/2013 0836 02/20/2013 0932  04-06-13 0004 April 06, 2013 0400 04/06/2013 0425  WBC 11.4* 20.2*  --   --   --  5.9  HGB 12.8* 13.6  < > 12.9* 12.6* 12.6*  HCT 38.5* 40.1  < > 38.0* 37.0* 37.8*  PLT 173 201  --   --   --  205  < > = values in this interval not displayed. Coag's  Recent Labs Lab 02/26/2013 0932  APTT 30  INR 1.20   BMET  Recent Labs Lab 03/13/2013 0836  03/15/2013 2136 03/12/2013 0004 03/13/2013 0400  NA 139  < > 144 141 141  K 3.1*  < > 3.2* 3.5 3.5  CL 104  < > 110 116* 118*  CO2 17*  --   --   --   --   BUN 26*  < > 20 23 19   CREATININE 1.14  < > 0.90 0.80 1.10  GLUCOSE 374*  < > 293* 269* 259*  < > = values in this interval not displayed. Electrolytes  Recent Labs Lab 02/22/2013 0836 03/10/2013 0932  CALCIUM 7.4*  --   MG 2.0  --   PHOS  --  2.2*   Sepsis Markers  Recent Labs Lab 03/02/2013 0901 03/11/2013 0935 03/12/2013 1200  LATICACIDVEN 6.46*  --  3.7*  PROCALCITON  --  <0.10  --    ABG  Recent Labs Lab 02/18/2013 1647 03/14/2013 1804  03/14/2013 0420  PHART 7.191* 7.257* 7.109*  PCO2ART 43.6 37.6 38.4  PO2ART 58.7* 56.0* 44.0*   Liver Enzymes  Recent Labs Lab 03/05/2013 0836  AST 173*  ALT 181*  ALKPHOS 54  BILITOT 0.5  ALBUMIN 2.9*   Cardiac Enzymes  Recent Labs Lab 02/19/2013 0934 03/10/2013 1430 03/05/2013 2015  TROPONINI 8.05* >20.00* >20.00*  PROBNP 130.9  --   --    Glucose  Recent Labs Lab 02/28/2013 2143 03/03/2013 2251 03/15/2013 2359 03/03/2013 0103 03/13/2013 0203 02/14/2013 0301  GLUCAP 255* 246* 244* 226* 219* 225*    Imaging Ct Head Wo Contrast  02/26/2013   CLINICAL DATA:  Syncope.  History of PEA arrest.  EXAM: CT HEAD WITHOUT CONTRAST  TECHNIQUE: Contiguous axial images were obtained from the base of the skull through the vertex without intravenous contrast.  COMPARISON:  No priors.  FINDINGS: Mild cerebral and cerebellar atrophy is age appropriate. No acute intracranial abnormalities. Specifically, no evidence of acute intracranial hemorrhage, no definite findings of acute/subacute cerebral ischemia, no mass, mass effect, hydrocephalus or abnormal intra or extra-axial fluid collections. Visualized paranasal sinuses and mastoids are generally well pneumatized, with exception of some mild multifocal mucosal thickening throughout the ethmoid and sphenoid sinuses bilaterally. No acute displaced skull fractures are identified.  IMPRESSION: 1. No acute intracranial abnormalities. 2. Mild cerebral and cerebellar atrophy. 3. Mild paranasal sinus disease, as above, without acute features.   Electronically Signed   By: Trudie Reed M.D.   On: 03/05/2013 10:43   Ct Angio Chest Pe W/cm &/or Wo Cm  02/19/2013   CLINICAL DATA:  Syncope.  PEA arrest.  EXAM: CT ANGIOGRAPHY CHEST WITH CONTRAST  TECHNIQUE: Multidetector CT imaging of the chest was performed using the standard protocol during bolus administration of intravenous contrast. Multiplanar CT image reconstructions including MIPs were obtained to evaluate the  vascular anatomy.  CONTRAST:  80mL OMNIPAQUE IOHEXOL 350 MG/ML SOLN  COMPARISON:  No priors.  FINDINGS: Mediastinum: There are no well-defined centrally located filling defects within the pulmonary arteries to suggest acute pulmonary embolism. Within several lower lobe branches in the lungs bilaterally there are some very ill-defined eccentric filling defects, which are favored to represent artifact related to decreased flow (less likely, these could represent areas of chronic thromboembolic disease). Heart size is normal, however, there is a rounding of the apex of the left ventricle (best demonstrated on images 73-75  of series 5) which is unusual, and can be seen in the setting of chronic aneurysm or acute ischemia with acute wall motion abnormality. There is no significant pericardial fluid, thickening or pericardial calcification. There is atherosclerosis of the thoracic aorta, the great vessels of the mediastinum and the coronary arteries, including calcified atherosclerotic plaque in the left main, left anterior descending, left circumflex and right coronary arteries. No pathologically enlarged mediastinal or hilar lymph nodes. Esophagus is unremarkable in appearance. A nasogastric tube extends into the stomach. Patient is intubated, with the tip of the endotracheal tube in the trachea at the level of the aortic arch.  Lungs/Pleura: Extensive airspace consolidation is seen throughout the lower lobes of the lungs bilaterally, as well as the dependent portions of the remaining lobes of the lungs bilaterally, suggesting massive aspiration with severe aspiration pneumonia. No pleural effusions.  Upper Abdomen: Unremarkable.  Musculoskeletal: There are no aggressive appearing lytic or blastic lesions noted in the visualized portions of the skeleton.  Review of the MIP images confirms the above findings.  IMPRESSION: 1. No findings to suggest acute pulmonary embolism on today's examination. There are some  ill-defined peripheral filling defects within several pulmonary artery branches to the lower lobes of the lungs bilaterally, which are strongly favored to represent artifact related to sluggish flow in these regions, likely secondary to hypoxic pulmonary vasoconstriction in the setting of severe aspiration pneumonia (lower lobes of the lungs are nearly completely consolidated). These findings may less likely represent sequela of broad chronic thromboembolic disease, but this is not favored. 2. Atherosclerosis, including left main and 3 vessel coronary artery disease. Importantly, the apex of the left ventricle has a very rounded appearance. In the setting of acute chest pain, this could indicate acute LAD territory ischemia, but may alternatively represent a small chronic left ventricular apical aneurysm. Clinical correlation is recommended. 3. Extensive dependent airspace disease throughout the lungs bilaterally compatible with massive aspiration and severe aspiration pneumonia. 4. Additional findings, as above. These results were called by telephone at the time of interpretation on 03/15/2013 at 11:00 AM to Dr. Rolland Porter, who verbally acknowledged these results.   Electronically Signed   By: Trudie Reed M.D.   On: 02/28/2013 11:00   Dg Chest Port 1 View  03/13/2013   CLINICAL DATA:  Intubation.  EXAM: PORTABLE CHEST - 1 VIEW  COMPARISON:  Chest x-ray 02/15/2013 .  FINDINGS: Endotracheal tube tip projected 3.6 cm above the carina. NG tube tip noted projected over the proximal portion of the stomach. More distal placement may prove useful. Right IJ line noted in stable position. Swan-Ganz catheter is noted with its tip projected over the right pulmonary outflow tract. Metallic marker noted in the proximal descending aorta consistent with intra-aortic balloon pump. Defibrillator pads noted. Bilateral pulmonary edema. Heart size normal. No pneumothorax. No acute bony abnormality.  IMPRESSION: 1. NG tube tip  noted in the proximal stomach, more distal placement may prove useful. Lines and tube positions otherwise in good anatomic position. An aortic balloon catheter tip is noted in the proximal portion of the descending aorta. . 2. Bilateral pulmonary edema.   Electronically Signed   By: Maisie Fus  Register   On: 02/20/2013 16:54   Dg Chest Portable 1 View  03/12/2013   CLINICAL DATA:  Cardiac arrest.  Intubation.  EXAM: PORTABLE CHEST - 1 VIEW  COMPARISON:  03/06/2013.  FINDINGS: Endotracheal tube tip is noted at the carina, proximal repositioning suggested. Right IJ line noted with tip in  the upper portion of the right atrium. NG tube noted with its tip below the left hemidiaphragm. Defibrillator pads are noted. Bilateral pulmonary alveolar infiltrates consistent with pulmonary edema. No pleural effusion or pneumothorax. Cardiomegaly with pulmonary venous congestion is present.  IMPRESSION: 1. Endotracheal tube tip is at the carina, proximal repositioning suggested. 2. Congestive heart failure with bilateral pulmonary edema. These results were called by telephone at the time of interpretation on 03/11/2013 at 10:00 AM to Dr. Rory Percy , who verbally acknowledged these results.   Electronically Signed   By: Maisie Fus  Register   On: 03/11/2013 10:01   Dg Chest Port 1 View  02/28/2013   CLINICAL DATA:  Evaluate endotracheal tube placement.  EXAM: PORTABLE CHEST - 1 VIEW  COMPARISON:  Chest x-ray 03/05/2013.  FINDINGS: An endotracheal tube is in place with tip 1.3 cm above the carina. Nasogastric tube extends into the proximal stomach, with side port distal to the gastroesophageal junction. Transcutaneous defibrillator pads are seen projecting over the left hemithorax and left upper quadrant of the abdomen. Multiple surgical clips are seen projecting over the right axillary region and left hilar region. Lung volumes are low. There is cephalization of the pulmonary vasculature and slight indistinctness of the  interstitial markings suggestive of mild pulmonary edema. Mild cardiomegaly. Upper mediastinal contours are within normal limits.  IMPRESSION: 1. Support apparatus, as above. Endotracheal tube appears slightly low, only 1.3 cm above the carina, and could be withdrawn 2-3 cm for more optimal placement. 2. Mild interstitial pulmonary edema.   Electronically Signed   By: Trudie Reed M.D.   On: 02/23/2013 09:08        ASSESSMENT / PLAN:  PULMONARY A: VDRF secondary to PEA arrest, pulm edema P:   Full Vent Support  Check ABG STAT reviewed, increase MV , when at goal temp  likley will re reduce CXR  abg follow up  UPDATE: Currently refractory to interventions  CARDIOVASCULAR A: Witnessed PEA arrest ( CPR) ,Lactic Acid ~6  Shock ? Cardiogenic \ Concern is MI, cardiogenic shock RBBB- r/o pe, low clinical suspicion HTN , Hyperlipidemia   P:  Hypothermia protocol stooped 12-22 0600 Echo pending  CT chest PE protocol /CT head see results Trend card enzymes/EKG  Titrate Pressors for MAP >85 This likely is NSTEMI, cardiogenic shock,   RENAL Lab Results  Component Value Date   CREATININE 1.10 03/01/2013   CREATININE 0.80 02/19/2013   CREATININE 0.90 03/03/2013    Recent Labs Lab 02/19/2013 2136 02/24/2013 0004 03/12/2013 0400  K 3.2* 3.5 3.5      A:  Await renal failure P:   Check cmet awaited Replace electrolytes as indicated   volume may need reduction as pulm edema  GASTROINTESTINAL A:  Post arrest  P:   Protonix /sup  Npo LFt for shock liver  HEMATOLOGIC  Recent Labs  02/25/2013 0400 02/26/2013 0425  HGB 12.6* 12.6*    A:  S/p arrest P:  Labs pending  Hep /DVT prophy after stat head ct and cardiac anticoag needs discussed Cbc stat   INFECTIOUS A:  cxr w/ no sign of acute infection   P:   Labs pending  Tr temp/wbc  With hypothermia low threhsold abx  ENDOCRINE CBG (last 3)   Recent Labs  03/16/2013 0103 03/04/2013 0203 02/15/2013 0301   GLUCAP 226* 219* 225*     A:  Hypoglycemia during code x D 50  P:   Monitor bs closely  Add SSI as needed if  glu greater 200 x 2 d5 for fluids  NEUROLOGIC A:  Sedated -s/p PEA arrest  Minimal delay in CPR (x 15m) P:   CT head nad Sedation on vent for hypothermia paralysis  TODAY'S SUMMARY: 79 yo WM witnessed PEA arrest w/ CPR /ACLS x .  Hypothermia protocol. CT head/chest pending . Likely cardiogenic, MI. 12-22 refractory to all current interventions. Made a LCB per family, No CPR/Shock. Family is leaning towards full comfort mode. Update 10-22 0640, terminally extubated per family request. Pt extubated and then expired at 655AM 02/20/2013.  No autopsy. Family present.  Brett Canales Minor ACNP [facility] Pager [phone] till 3 pm If no answer page [phone]  Caryl Bis  [phone]  Cell  [phone]  If no response or cell goes to voicemail, call beeper [phone]  03/10/2013, 6:29 AM

## 2013-03-17 NOTE — Discharge Summary (Signed)
Physician Discharge Summary       Patient ID: Lucas Frazier MRN: [id number] DOB/AGE: 1934/11/10 79 y.o.  Admit date: 02/19/2013 Discharge date: 02/23/2013 655AM  Discharge Diagnoses:  Active Problems:   Cardiac arrest Acute myocardial infarction Nonstemi CAD    Hypertension   Hyperlipidemia   Detailed Hospital Course:  BRIEF PATIENT DESCRIPTION: 79 yo WM witnessed PEA arrest with 30 min of CPR/ACLS . Hypothermia protocol initiated. CT chest /head pending  SIGNIFICANT EVENTS / STUDIES:  12/21 Witnessed PEA arrest -20 min CPR/ACLS  LINES / TUBES:  12/21 ETT>>  12/21 R IJ >>  CULTURES:  ANTIBIOTICS:  HISTORY OF PRESENT ILLNESS:  79 year old male with known hypertension (on triamterene and metoprolol at home) and hyperlipidemia (on pravastatin at home), prior skin cancer who had a witnessed cardiac arrest on 12/21 at home. He was reading newspaper with his wife when he started to gasp for breath, stiffened up and was non-responsive with eyes wide opened. His wife called 911 and was advised to start CPR that she did with immediate chest compressions and mouth to mouth breathing. Shortly after EMS came and found the patient in PEA arrest. They continued CPR for 20 minutes with 3 epinephrine injections and the patient went into VT from which he was shocked into SR . Received 3 epi . 1 D50 . IVF x 1 L infused . Upon arrival to the ER he had some spontaneous movement of his extremities. Based on his family he was very active, with no complains of chest pain or SOB . PCCM asked to admit. Pt is intubated on vent . Awaiting CT head and chest -PE protocol. All labs are pending . EKG with R. BBB  Hypotensive started on pressors .    Pt supported on vent but overnight developed acute pulm hemorrhage and not able to bag this pt.  Pt developed progressive hypoxemia and shock .  Further discussion with family and elected to make comfort care and this occurred at 645AM .  Pt extubated terminally  and expired at 655AM  No autopsy.   Discharge Exam: BP 110/54  Pulse 87  Temp(Src) 91.8 F (33.2 C) (Core (Comment))  Resp 30  Ht 5\' 10"  (1.778 m)  Wt 78.926 kg (174 lb)  BMI 24.97 kg/m2  SpO2 85%  Pt expired   Labs at discharge Lab Results  Component Value Date   CREATININE 1.09 03/01/2013   BUN 23 08-Mar-2013   NA 140 03/09/2013   K 3.6 March 08, 2013   CL 113* 02/24/2013   CO2 12* 08-Mar-2013   Lab Results  Component Value Date   WBC 5.9 March 08, 2013   HGB 12.6* 02/20/2013   HCT 37.8* 02/23/2013   MCV 85.9 02/17/2013   PLT 205 02/24/2013   Lab Results  Component Value Date   ALT 160* 03/10/2013   AST 276* 02/19/2013   ALKPHOS 39 03/01/2013   BILITOT 0.4 02/15/2013   Lab Results  Component Value Date   INR 1.20 02/17/2013    Current radiology studies Ct Head Wo Contrast  03/06/2013   CLINICAL DATA:  Syncope.  History of PEA arrest.  EXAM: CT HEAD WITHOUT CONTRAST  TECHNIQUE: Contiguous axial images were obtained from the base of the skull through the vertex without intravenous contrast.  COMPARISON:  No priors.  FINDINGS: Mild cerebral and cerebellar atrophy is age appropriate. No acute intracranial abnormalities. Specifically, no evidence of acute intracranial hemorrhage, no definite findings of acute/subacute cerebral ischemia, no mass, mass effect, hydrocephalus  or abnormal intra or extra-axial fluid collections. Visualized paranasal sinuses and mastoids are generally well pneumatized, with exception of some mild multifocal mucosal thickening throughout the ethmoid and sphenoid sinuses bilaterally. No acute displaced skull fractures are identified.  IMPRESSION: 1. No acute intracranial abnormalities. 2. Mild cerebral and cerebellar atrophy. 3. Mild paranasal sinus disease, as above, without acute features.   Electronically Signed   By: Trudie Reed M.D.   On: March 28, 2013 10:43   Ct Angio Chest Pe W/cm &/or Wo Cm  28-Mar-2013   CLINICAL DATA:  Syncope.  PEA arrest.   EXAM: CT ANGIOGRAPHY CHEST WITH CONTRAST  TECHNIQUE: Multidetector CT imaging of the chest was performed using the standard protocol during bolus administration of intravenous contrast. Multiplanar CT image reconstructions including MIPs were obtained to evaluate the vascular anatomy.  CONTRAST:  80mL OMNIPAQUE IOHEXOL 350 MG/ML SOLN  COMPARISON:  No priors.  FINDINGS: Mediastinum: There are no well-defined centrally located filling defects within the pulmonary arteries to suggest acute pulmonary embolism. Within several lower lobe branches in the lungs bilaterally there are some very ill-defined eccentric filling defects, which are favored to represent artifact related to decreased flow (less likely, these could represent areas of chronic thromboembolic disease). Heart size is normal, however, there is a rounding of the apex of the left ventricle (best demonstrated on images 73-75 of series 5) which is unusual, and can be seen in the setting of chronic aneurysm or acute ischemia with acute wall motion abnormality. There is no significant pericardial fluid, thickening or pericardial calcification. There is atherosclerosis of the thoracic aorta, the great vessels of the mediastinum and the coronary arteries, including calcified atherosclerotic plaque in the left main, left anterior descending, left circumflex and right coronary arteries. No pathologically enlarged mediastinal or hilar lymph nodes. Esophagus is unremarkable in appearance. A nasogastric tube extends into the stomach. Patient is intubated, with the tip of the endotracheal tube in the trachea at the level of the aortic arch.  Lungs/Pleura: Extensive airspace consolidation is seen throughout the lower lobes of the lungs bilaterally, as well as the dependent portions of the remaining lobes of the lungs bilaterally, suggesting massive aspiration with severe aspiration pneumonia. No pleural effusions.  Upper Abdomen: Unremarkable.  Musculoskeletal: There  are no aggressive appearing lytic or blastic lesions noted in the visualized portions of the skeleton.  Review of the MIP images confirms the above findings.  IMPRESSION: 1. No findings to suggest acute pulmonary embolism on today's examination. There are some ill-defined peripheral filling defects within several pulmonary artery branches to the lower lobes of the lungs bilaterally, which are strongly favored to represent artifact related to sluggish flow in these regions, likely secondary to hypoxic pulmonary vasoconstriction in the setting of severe aspiration pneumonia (lower lobes of the lungs are nearly completely consolidated). These findings may less likely represent sequela of broad chronic thromboembolic disease, but this is not favored. 2. Atherosclerosis, including left main and 3 vessel coronary artery disease. Importantly, the apex of the left ventricle has a very rounded appearance. In the setting of acute chest pain, this could indicate acute LAD territory ischemia, but may alternatively represent a small chronic left ventricular apical aneurysm. Clinical correlation is recommended. 3. Extensive dependent airspace disease throughout the lungs bilaterally compatible with massive aspiration and severe aspiration pneumonia. 4. Additional findings, as above. These results were called by telephone at the time of interpretation on 03-28-2013 at 11:00 AM to Dr. Rolland Porter, who verbally acknowledged these results.   Electronically  Signed   By: Trudie Reed M.D.   On: 02/28/2013 11:00   Dg Chest Port 1 View  03/14/2013   CLINICAL DATA:  Check endotracheal tube position.  EXAM: PORTABLE CHEST - 1 VIEW  COMPARISON:  Chest radiograph performed 02/19/2013  FINDINGS: The patient's endotracheal tube is seen ending 4 cm above the carina. A right IJ line is noted ending about the distal SVC. An enteric tube is noted extending below the diaphragm.  There has been mild interval improvement in right-sided  airspace opacification, and diffuse worsening left-sided airspace opacity, compatible with redistribution of multifocal pneumonia or pulmonary edema. ARDS cannot be excluded. No definite pleural effusion or pneumothorax is seen, though the left costophrenic angle is incompletely imaged on this study.  The cardiomediastinal silhouette remains normal in size. An external pacing pad is noted overlying the left hemithorax. No acute osseous abnormalities are seen.  IMPRESSION: 1. Endotracheal tube seen ending 4 cm above the carina. 2. Interval redistribution of bilateral airspace opacities, improved on the right and worsened on the left. This may reflect redistribution of multifocal pneumonia or pulmonary edema; ARDS cannot be excluded.   Electronically Signed   By: Roanna Raider M.D.   On: 14-Mar-2013 06:36   Dg Chest Port 1 View  02/17/2013   CLINICAL DATA:  Intubation.  EXAM: PORTABLE CHEST - 1 VIEW  COMPARISON:  Chest x-ray 03/12/2013 .  FINDINGS: Endotracheal tube tip projected 3.6 cm above the carina. NG tube tip noted projected over the proximal portion of the stomach. More distal placement may prove useful. Right IJ line noted in stable position. Swan-Ganz catheter is noted with its tip projected over the right pulmonary outflow tract. Metallic marker noted in the proximal descending aorta consistent with intra-aortic balloon pump. Defibrillator pads noted. Bilateral pulmonary edema. Heart size normal. No pneumothorax. No acute bony abnormality.  IMPRESSION: 1. NG tube tip noted in the proximal stomach, more distal placement may prove useful. Lines and tube positions otherwise in good anatomic position. An aortic balloon catheter tip is noted in the proximal portion of the descending aorta. . 2. Bilateral pulmonary edema.   Electronically Signed   By: Maisie Fus  Register   On: 03/12/2013 16:54   Dg Chest Portable 1 View  03/10/2013   CLINICAL DATA:  Cardiac arrest.  Intubation.  EXAM: PORTABLE CHEST - 1 VIEW   COMPARISON:  02/22/2013.  FINDINGS: Endotracheal tube tip is noted at the carina, proximal repositioning suggested. Right IJ line noted with tip in the upper portion of the right atrium. NG tube noted with its tip below the left hemidiaphragm. Defibrillator pads are noted. Bilateral pulmonary alveolar infiltrates consistent with pulmonary edema. No pleural effusion or pneumothorax. Cardiomegaly with pulmonary venous congestion is present.  IMPRESSION: 1. Endotracheal tube tip is at the carina, proximal repositioning suggested. 2. Congestive heart failure with bilateral pulmonary edema. These results were called by telephone at the time of interpretation on 02/25/2013 at 10:00 AM to Dr. Rory Percy , who verbally acknowledged these results.   Electronically Signed   By: Maisie Fus  Register   On: 02/22/2013 10:01   Dg Chest Port 1 View  02/19/2013   CLINICAL DATA:  Evaluate endotracheal tube placement.  EXAM: PORTABLE CHEST - 1 VIEW  COMPARISON:  Chest x-ray 03/05/2013.  FINDINGS: An endotracheal tube is in place with tip 1.3 cm above the carina. Nasogastric tube extends into the proximal stomach, with side port distal to the gastroesophageal junction. Transcutaneous defibrillator pads are seen projecting over  the left hemithorax and left upper quadrant of the abdomen. Multiple surgical clips are seen projecting over the right axillary region and left hilar region. Lung volumes are low. There is cephalization of the pulmonary vasculature and slight indistinctness of the interstitial markings suggestive of mild pulmonary edema. Mild cardiomegaly. Upper mediastinal contours are within normal limits.  IMPRESSION: 1. Support apparatus, as above. Endotracheal tube appears slightly low, only 1.3 cm above the carina, and could be withdrawn 2-3 cm for more optimal placement. 2. Mild interstitial pulmonary edema.   Electronically Signed   By: Trudie Reed M.D.   On: Mar 18, 2013 09:08   Signed: Shan Levans  MD Beeper  [phone]  Cell  [phone]  If no response or cell goes to voicemail, call beeper [phone]   03/16/2013, 6:58 AM

## 2013-03-17 NOTE — Progress Notes (Addendum)
  Called to see patient due to progressive deterioration overnight.  On my arrival was on [facility] protocol, IABP 1:1, norepinephrine 50, vasopressin, dopamine 5, and bicarb drips.   MAPs marginal in low 60s. Requiring frequent bicarb pushes to maintain. Frank blood coming from ETT. CXR with diffuse infiltrates - concerning for pulmonary hemorrhage.  On exam he is ventilated with frank blood in ETT Cor: reg +s3 Lungs diffuse crackles Ab soft Ext with arctic sun pads  Discussions held with CCM team as well as family to proceed with Comfort Care. Terminal extubation was performed.  Total Critical Care Time 45 mins.   Truman Hayward 6:48 AM

## 2013-03-17 NOTE — Progress Notes (Signed)
ANTICOAGULATION CONSULT NOTE - Follow Up Consult  Pharmacy Consult for heparin Indication: IABP on hypothermia s/p STEMI/cardiac arrest  Labs:  Recent Labs  02/23/2013 0836 02/19/2013 0932 03/02/2013 0934 03/03/2013 1430  02/20/2013 2006 02/27/2013 2015 02/14/2013 2136 03/03/2013 2200 2013-03-29 0004  HGB 12.8* 13.6  --   --   < > 13.6  --  13.9  --  12.9*  HCT 38.5* 40.1  --   --   < > 40.0  --  41.0  --  38.0*  PLT 173 201  --   --   --   --   --   --   --   --   APTT  --  30  --   --   --   --   --   --   --   --   LABPROT  --  14.9  --   --   --   --   --   --   --   --   INR  --  1.20  --   --   --   --   --   --   --   --   HEPARINUNFRC  --   --   --   --   --   --   --   --  1.14*  --   CREATININE 1.14 0.91  --   --   < > 0.80  --  0.90  --  0.80  TROPONINI  --   --  8.05* >20.00*  --   --  >20.00*  --   --   --   < > = values in this interval not displayed.   Assessment: 79yo male supratherapeutic on heparin with initial dosing for IABP s/p STEMI, currently on hypothermia; per RN pt is oozing from multiple sites, Hgb down 1 point.  Goal of Therapy:  Heparin level 0.2-0.5 units/ml   Plan:  Will hold heparin gtt x2hr then resume at 600 units/hr and check level in 6hr.  Vernard Gambles, PharmD, BCPS  March 29, 2013,2:28 AM

## 2013-03-17 DEATH — deceased

## 2014-02-23 ENCOUNTER — Encounter (HOSPITAL_COMMUNITY): Payer: Self-pay | Admitting: Interventional Cardiology

## 2014-11-12 IMAGING — CT CT ANGIO CHEST
3 of 9 series · 17 of 36 positions shown · IV contrast (APPLIED)
Comparison: No priors.

CLINICAL DATA: Syncope.  LESYA arrest.

EXAM:
CT ANGIOGRAPHY CHEST WITH CONTRAST
TECHNIQUE: Multidetector CT imaging of the chest was performed using the
standard protocol during bolus administration of intravenous
contrast. Multiplanar CT image reconstructions including MIPs were
obtained to evaluate the vascular anatomy.
CONTRAST:  80mL OMNIPAQUE IOHEXOL 350 MG/ML SOLN

[Series 6: thins · axial · 0.78mm/px · z∈[+27,+275]mm · 14 of 288 slices shown]
[im 20/288  lung]
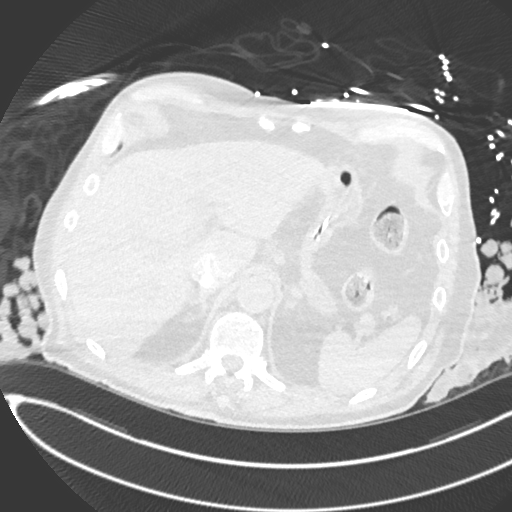
[im 39/288  mediastinal]
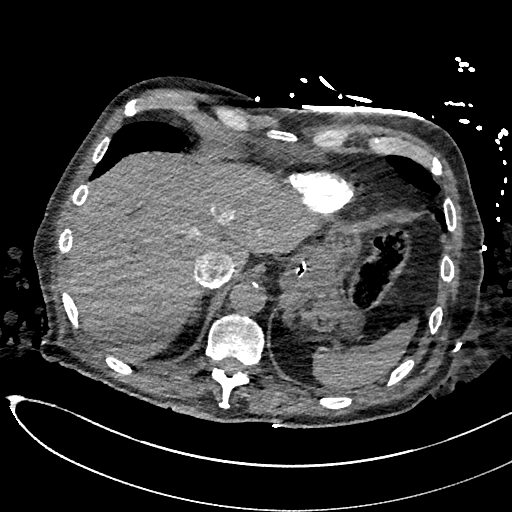
[im 58/288  lung]
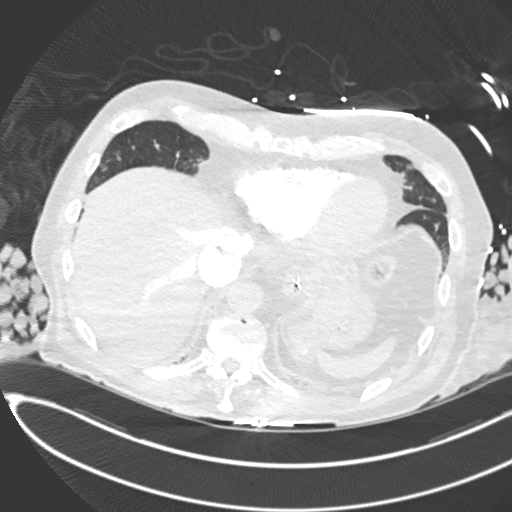
[im 77/288  mediastinal]
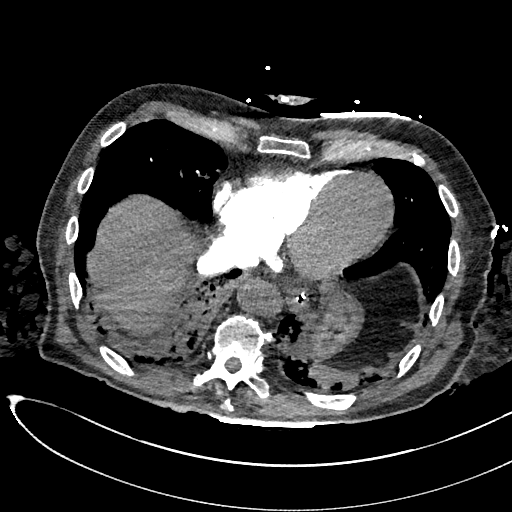
[im 96/288  lung]
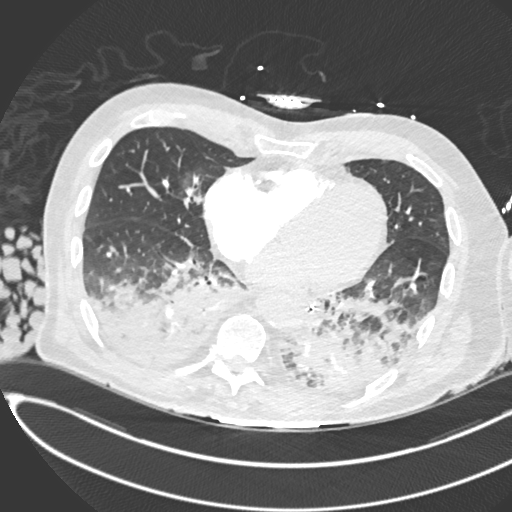
[im 115/288  mediastinal]
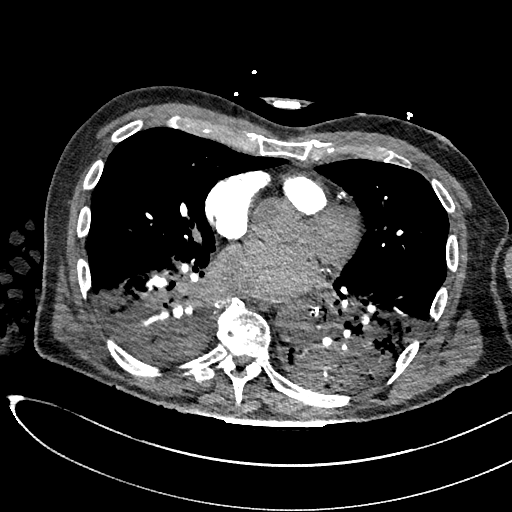
[im 134/288  lung]
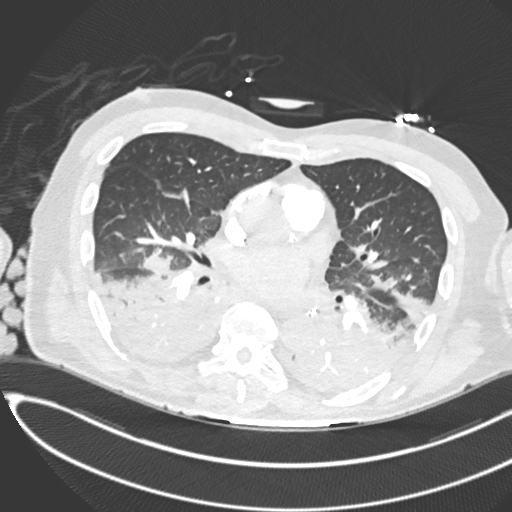
[im 154/288  mediastinal]
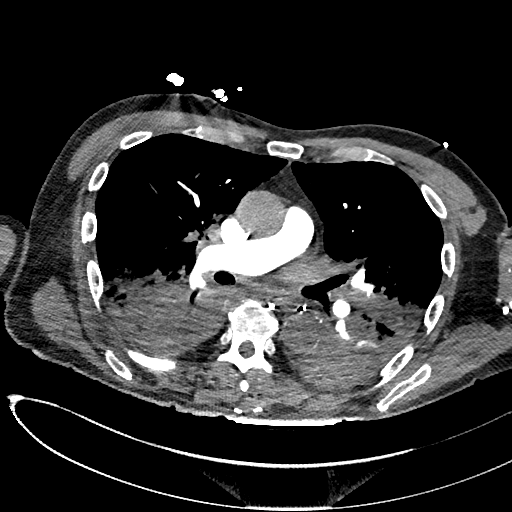
[im 173/288  lung]
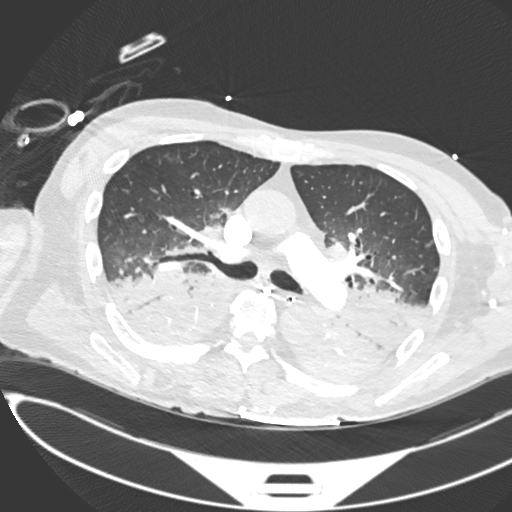
[im 192/288  mediastinal]
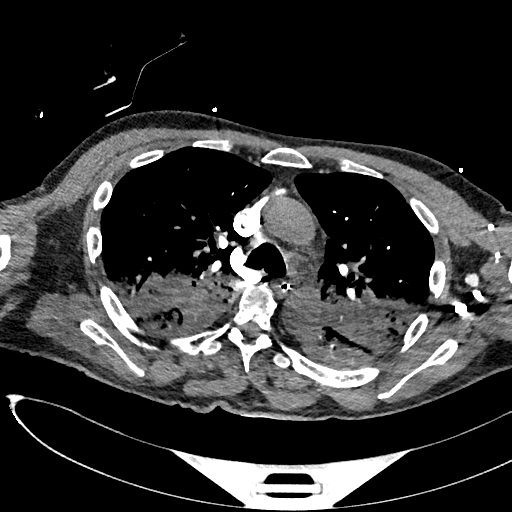
[im 211/288  lung]
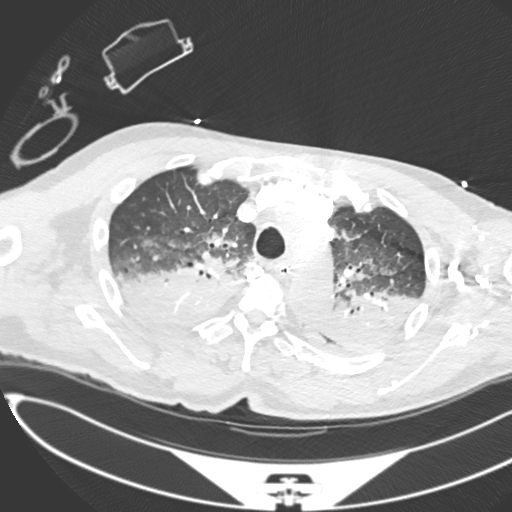
[im 230/288  mediastinal]
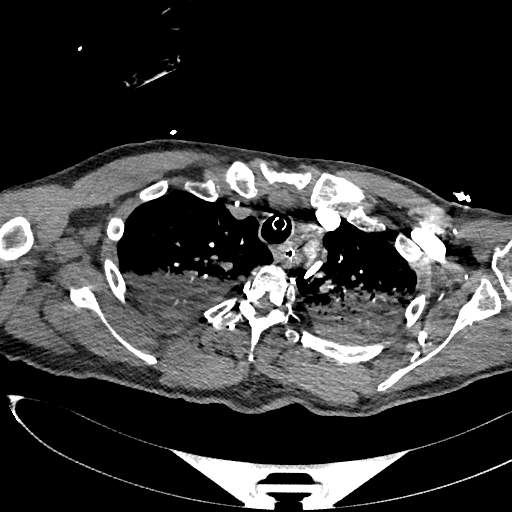
[im 249/288  lung]
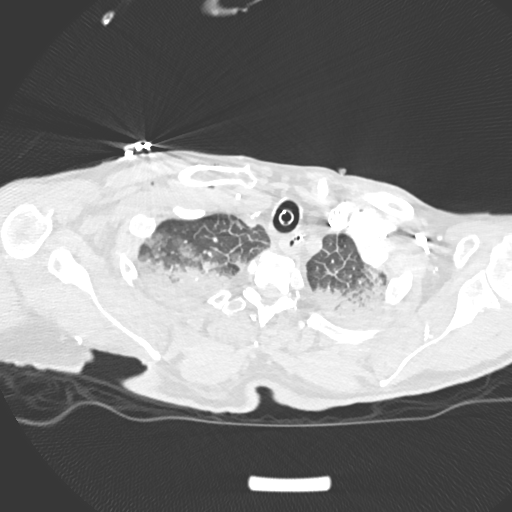
[im 268/288  mediastinal]
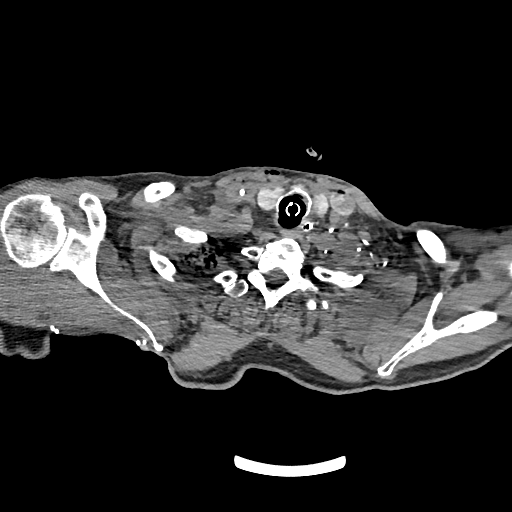

[Series 7: lung · axial · 0.94mm/px · z∈[+77,+146]mm · 2 of 91 slices shown]
[im 23/91  mediastinal]
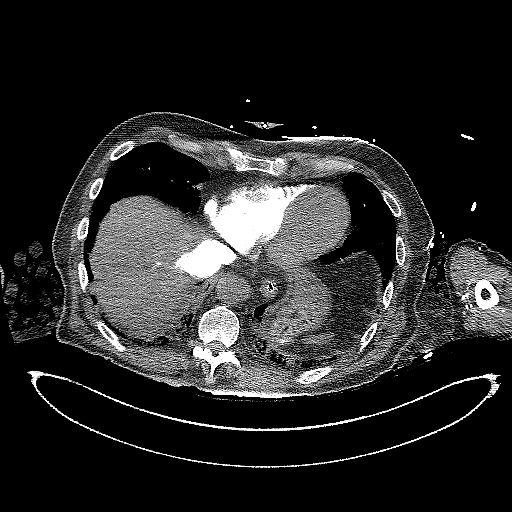
[im 46/91  mediastinal]
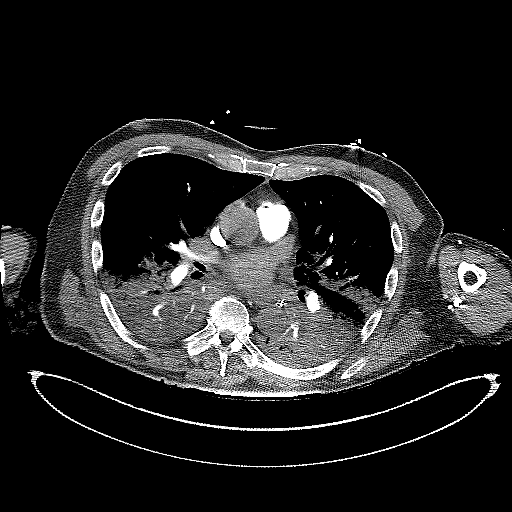

[Series 8: coronal mpr · coronal · 0.68mm/px · 1 of 113 slices shown]
[im 57/113  mediastinal]
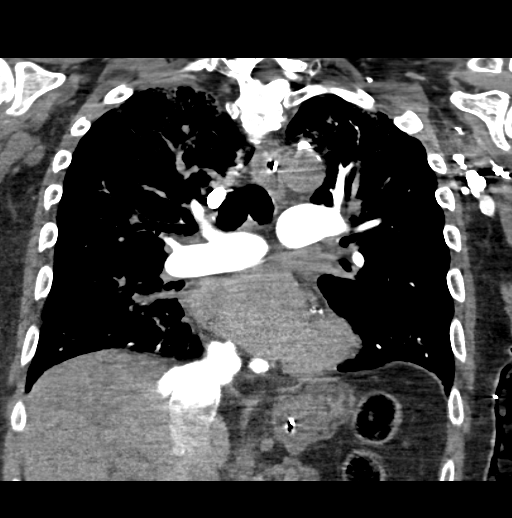

[17 of 36 positions shown; findings below may reference images not displayed]

FINDINGS: Mediastinum: There are no well-defined centrally located filling
defects within the pulmonary arteries to suggest acute pulmonary
embolism. Within several lower lobe branches in the lungs
bilaterally there are some very ill-defined eccentric filling
defects, which are favored to represent artifact related to
decreased flow (less likely, these could represent areas of chronic
thromboembolic disease). Heart size is normal, however, there is a
rounding of the apex of the left ventricle (best demonstrated on
images 73-75 of series 5) which is unusual, and can be seen in the
setting of chronic aneurysm or acute ischemia with acute wall motion
abnormality. There is no significant pericardial fluid, thickening
or pericardial calcification. There is atherosclerosis of the
thoracic aorta, the great vessels of the mediastinum and the
coronary arteries, including calcified atherosclerotic plaque in the
left main, left anterior descending, left circumflex and right
coronary arteries. No pathologically enlarged mediastinal or hilar
lymph nodes. Esophagus is unremarkable in appearance. A nasogastric
tube extends into the stomach. Patient is intubated, with the tip of
the endotracheal tube in the trachea at the level of the aortic
arch.

Lungs/Pleura: Extensive airspace consolidation is seen throughout
the lower lobes of the lungs bilaterally, as well as the dependent
portions of the remaining lobes of the lungs bilaterally, suggesting
massive aspiration with severe aspiration pneumonia. No pleural
effusions.

Upper Abdomen: Unremarkable.

Musculoskeletal: There are no aggressive appearing lytic or blastic
lesions noted in the visualized portions of the skeleton.

Review of the MIP images confirms the above findings.
IMPRESSION: 1. No findings to suggest acute pulmonary embolism on today's
examination. There are some ill-defined peripheral filling defects
within several pulmonary artery branches to the lower lobes of the
lungs bilaterally, which are strongly favored to represent artifact
related to sluggish flow in these regions, likely secondary to
hypoxic pulmonary vasoconstriction in the setting of severe
aspiration pneumonia (lower lobes of the lungs are nearly completely
consolidated). These findings may less likely represent sequela of
broad chronic thromboembolic disease, but this is not favored.
2. Atherosclerosis, including left main and 3 vessel coronary artery
disease. Importantly, the apex of the left ventricle has a very
rounded appearance. In the setting of acute chest pain, this could
indicate acute LAD territory ischemia, but may alternatively
represent a small chronic left ventricular apical aneurysm. Clinical
correlation is recommended.
3. Extensive dependent airspace disease throughout the lungs
bilaterally compatible with massive aspiration and severe aspiration
pneumonia.
4. Additional findings, as above.
These results were called by telephone at the time of interpretation
on 03/06/2013 at [DATE] to Dr. WEGDERES MACALIN AXMED, who verbally
acknowledged these results.

## 2014-11-13 IMAGING — DX DG CHEST 1V PORT
1 series · 1 of 1 positions shown · non-contrast
Comparison: Chest radiograph performed 03/06/2013

CLINICAL DATA: Check endotracheal tube position.

EXAM:
PORTABLE CHEST - 1 VIEW

[portable]
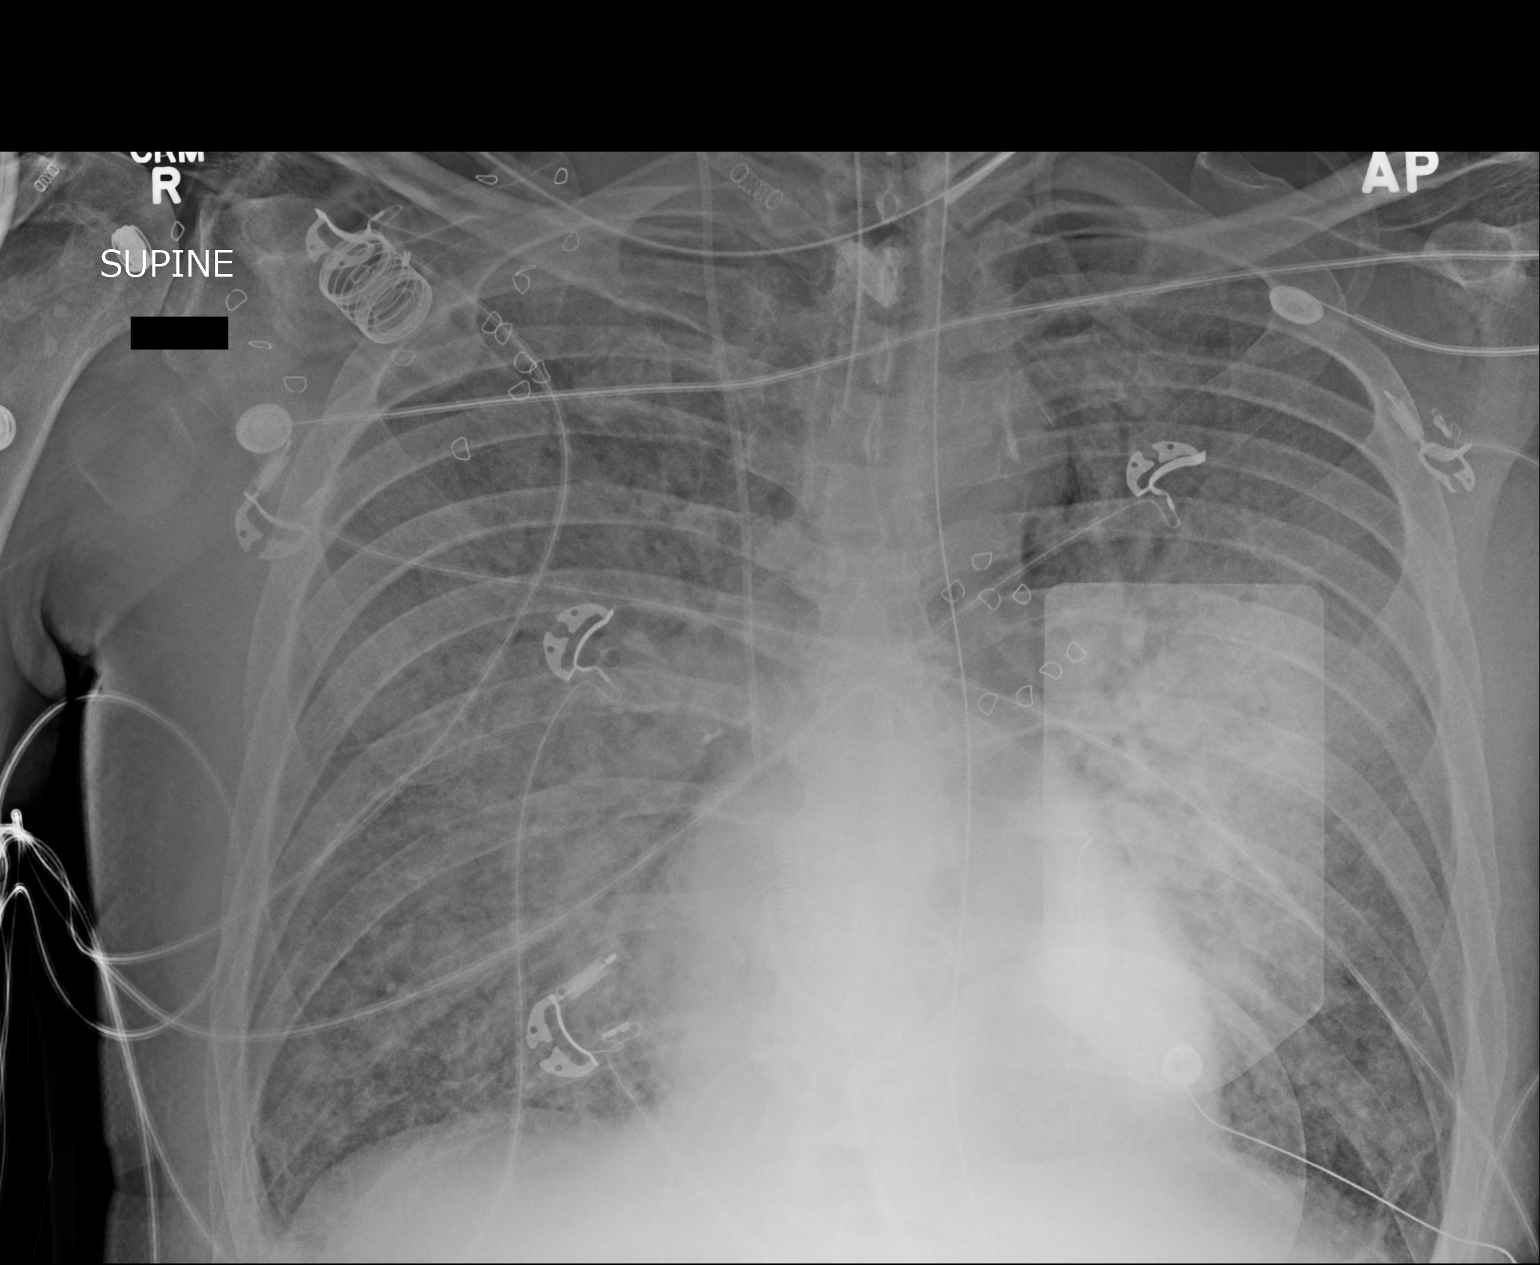

[1 of 1 positions shown; findings below may reference images not displayed]

FINDINGS: The patient's endotracheal tube is seen ending 4 cm above the
carina. A right IJ line is noted ending about the distal SVC. An
enteric tube is noted extending below the diaphragm.

There has been mild interval improvement in right-sided airspace
opacification, and diffuse worsening left-sided airspace opacity,
compatible with redistribution of multifocal pneumonia or pulmonary
edema. ARDS cannot be excluded. No definite pleural effusion or
pneumothorax is seen, though the left costophrenic angle is
incompletely imaged on this study.

The cardiomediastinal silhouette remains normal in size. An external
pacing pad is noted overlying the left hemithorax. No acute osseous
abnormalities are seen.
IMPRESSION: 1. Endotracheal tube seen ending 4 cm above the carina.
2. Interval redistribution of bilateral airspace opacities, improved
on the right and worsened on the left. This may reflect
redistribution of multifocal pneumonia or pulmonary edema; ARDS
cannot be excluded.
